# Patient Record
Sex: Male | Born: 1980 | Race: Black or African American | Hispanic: No | Marital: Single | Smoking: Current every day smoker
Health system: Southern US, Community
[De-identification: ages and names within clinical notes are randomized; demographics above are authoritative.]

## PROBLEM LIST (undated history)

## (undated) DIAGNOSIS — Z9049 Acquired absence of other specified parts of digestive tract: Secondary | ICD-10-CM

## (undated) DIAGNOSIS — W3400XA Accidental discharge from unspecified firearms or gun, initial encounter: Secondary | ICD-10-CM

## (undated) DIAGNOSIS — K56609 Unspecified intestinal obstruction, unspecified as to partial versus complete obstruction: Secondary | ICD-10-CM

## (undated) HISTORY — PX: ABDOMINAL SURGERY: SHX537

## (undated) HISTORY — PX: CHOLECYSTECTOMY: SHX55

## (undated) HISTORY — PX: HIP SURGERY: SHX245

## (undated) HISTORY — PX: ARM WOUND REPAIR / CLOSURE: SUR1141

---

## 2010-04-22 ENCOUNTER — Inpatient Hospital Stay (HOSPITAL_COMMUNITY): Payer: Self-pay

## 2010-04-22 ENCOUNTER — Inpatient Hospital Stay (HOSPITAL_COMMUNITY)
Admission: EM | Admit: 2010-04-22 | Discharge: 2010-05-25 | DRG: 957 | Disposition: A | Discharge status: Home Health | Payer: MEDICAID | Attending: Surgery | Admitting: Surgery

## 2010-04-22 ENCOUNTER — Emergency Department (HOSPITAL_COMMUNITY): Payer: Self-pay

## 2010-04-22 ENCOUNTER — Other Ambulatory Visit: Payer: Self-pay | Admitting: General Surgery

## 2010-04-22 DIAGNOSIS — S21109A Unspecified open wound of unspecified front wall of thorax without penetration into thoracic cavity, initial encounter: Secondary | ICD-10-CM | POA: Diagnosis present

## 2010-04-22 DIAGNOSIS — IMO0002 Reserved for concepts with insufficient information to code with codable children: Secondary | ICD-10-CM | POA: Diagnosis not present

## 2010-04-22 DIAGNOSIS — S71009A Unspecified open wound, unspecified hip, initial encounter: Secondary | ICD-10-CM | POA: Diagnosis present

## 2010-04-22 DIAGNOSIS — T794XXA Traumatic shock, initial encounter: Secondary | ICD-10-CM | POA: Diagnosis present

## 2010-04-22 DIAGNOSIS — D62 Acute posthemorrhagic anemia: Secondary | ICD-10-CM | POA: Diagnosis not present

## 2010-04-22 DIAGNOSIS — S52209B Unspecified fracture of shaft of unspecified ulna, initial encounter for open fracture type I or II: Secondary | ICD-10-CM | POA: Diagnosis present

## 2010-04-22 DIAGNOSIS — T8140XA Infection following a procedure, unspecified, initial encounter: Secondary | ICD-10-CM | POA: Diagnosis not present

## 2010-04-22 DIAGNOSIS — K929 Disease of digestive system, unspecified: Secondary | ICD-10-CM | POA: Diagnosis not present

## 2010-04-22 DIAGNOSIS — S36499A Other injury of unspecified part of small intestine, initial encounter: Principal | ICD-10-CM | POA: Diagnosis present

## 2010-04-22 DIAGNOSIS — S36129A Unspecified injury of gallbladder, initial encounter: Secondary | ICD-10-CM | POA: Diagnosis present

## 2010-04-22 DIAGNOSIS — Y836 Removal of other organ (partial) (total) as the cause of abnormal reaction of the patient, or of later complication, without mention of misadventure at the time of the procedure: Secondary | ICD-10-CM | POA: Diagnosis not present

## 2010-04-22 DIAGNOSIS — S36500A Unspecified injury of ascending [right] colon, initial encounter: Secondary | ICD-10-CM | POA: Diagnosis present

## 2010-04-22 DIAGNOSIS — R7309 Other abnormal glucose: Secondary | ICD-10-CM | POA: Diagnosis not present

## 2010-04-22 DIAGNOSIS — L02219 Cutaneous abscess of trunk, unspecified: Secondary | ICD-10-CM | POA: Diagnosis not present

## 2010-04-22 DIAGNOSIS — S3613XA Injury of bile duct, initial encounter: Secondary | ICD-10-CM | POA: Diagnosis present

## 2010-04-22 DIAGNOSIS — K560 Paralytic ileus: Secondary | ICD-10-CM | POA: Diagnosis not present

## 2010-04-22 DIAGNOSIS — S71109A Unspecified open wound, unspecified thigh, initial encounter: Secondary | ICD-10-CM | POA: Diagnosis present

## 2010-04-22 DIAGNOSIS — E876 Hypokalemia: Secondary | ICD-10-CM | POA: Diagnosis not present

## 2010-04-22 DIAGNOSIS — K651 Peritoneal abscess: Secondary | ICD-10-CM | POA: Diagnosis not present

## 2010-04-22 DIAGNOSIS — Y832 Surgical operation with anastomosis, bypass or graft as the cause of abnormal reaction of the patient, or of later complication, without mention of misadventure at the time of the procedure: Secondary | ICD-10-CM | POA: Diagnosis not present

## 2010-04-22 DIAGNOSIS — S0100XA Unspecified open wound of scalp, initial encounter: Secondary | ICD-10-CM | POA: Diagnosis present

## 2010-04-22 DIAGNOSIS — S36116A Major laceration of liver, initial encounter: Secondary | ICD-10-CM | POA: Diagnosis present

## 2010-04-22 DIAGNOSIS — S271XXA Traumatic hemothorax, initial encounter: Secondary | ICD-10-CM | POA: Diagnosis present

## 2010-04-22 DIAGNOSIS — G43909 Migraine, unspecified, not intractable, without status migrainosus: Secondary | ICD-10-CM | POA: Diagnosis present

## 2010-04-22 LAB — POCT I-STAT, CHEM 8
Creatinine, Ser: 1.4 mg/dL (ref 0.4–1.5)
Hemoglobin: 15.3 g/dL (ref 13.0–17.0)
Sodium: 142 meq/L (ref 135–145)
TCO2: 24 mmol/L (ref 0–100)

## 2010-04-22 LAB — CBC
HCT: 32.4 % — ABNORMAL LOW (ref 39.0–52.0)
HCT: 32.9 % — ABNORMAL LOW (ref 39.0–52.0)
Hemoglobin: 11.3 g/dL — ABNORMAL LOW (ref 13.0–17.0)
MCH: 26.3 pg (ref 26.0–34.0)
MCH: 27 pg (ref 26.0–34.0)
MCHC: 33.3 g/dL (ref 30.0–36.0)
MCHC: 34.3 g/dL (ref 30.0–36.0)
Platelets: 201 10*3/uL (ref 150–400)
RDW: 14.5 % (ref 11.5–15.5)
RDW: 14.9 % (ref 11.5–15.5)
WBC: 3.3 10*3/uL — ABNORMAL LOW (ref 4.0–10.5)

## 2010-04-22 LAB — COMPREHENSIVE METABOLIC PANEL
Albumin: 3.3 g/dL — ABNORMAL LOW (ref 3.5–5.2)
BUN: 10 mg/dL (ref 6–23)
Chloride: 104 mEq/L (ref 96–112)
Creatinine, Ser: 1.29 mg/dL (ref 0.4–1.5)
Total Bilirubin: 0.7 mg/dL (ref 0.3–1.2)

## 2010-04-22 LAB — POCT I-STAT 7, (LYTES, BLD GAS, ICA,H+H)
Acid-base deficit: 4 mmol/L — ABNORMAL HIGH (ref 0.0–2.0)
Bicarbonate: 23.6 meq/L (ref 20.0–24.0)
Calcium, Ion: 0.35 mmol/L — CL (ref 1.12–1.32)
HCT: 34 % — ABNORMAL LOW (ref 39.0–52.0)
Hemoglobin: 11.6 g/dL — ABNORMAL LOW (ref 13.0–17.0)
O2 Saturation: 100 %
Potassium: 4.4 meq/L (ref 3.5–5.1)
Potassium: 6.7 meq/L (ref 3.5–5.1)
Sodium: 139 meq/L (ref 135–145)
Sodium: 141 meq/L (ref 135–145)
TCO2: 23 mmol/L (ref 0–100)
pH, Arterial: 7.264 — ABNORMAL LOW (ref 7.350–7.450)

## 2010-04-22 LAB — BASIC METABOLIC PANEL
Calcium: 7.4 mg/dL — ABNORMAL LOW (ref 8.4–10.5)
Chloride: 112 mEq/L (ref 96–112)
Creatinine, Ser: 0.92 mg/dL (ref 0.4–1.5)
Creatinine, Ser: 1.04 mg/dL (ref 0.4–1.5)
GFR calc Af Amer: >60 mL/min (ref >60)
GFR calc non Af Amer: >60 mL/min (ref >60)
GFR calc non Af Amer: >60 mL/min (ref >60)
Glucose, Bld: 144 mg/dL — ABNORMAL HIGH (ref 70–99)
Potassium: 3.5 mEq/L (ref 3.5–5.1)
Sodium: 142 mEq/L (ref 135–145)

## 2010-04-22 LAB — PROTIME-INR
INR: 1.01 (ref 0.00–1.49)
INR: 1.26 (ref 0.00–1.49)
Prothrombin Time: 13.5 seconds (ref 11.6–15.2)
Prothrombin Time: 16 seconds — ABNORMAL HIGH (ref 11.6–15.2)

## 2010-04-22 LAB — GLUCOSE, CAPILLARY
Glucose-Capillary: 209 mg/dL — ABNORMAL HIGH (ref 70–99)
Glucose-Capillary: 140 mg/dL — ABNORMAL HIGH (ref 70–99)

## 2010-04-22 LAB — MRSA PCR SCREENING: MRSA by PCR: NEGATIVE

## 2010-04-22 LAB — LACTIC ACID, PLASMA
Lactic Acid, Venous: 5.2 mmol/L — ABNORMAL HIGH (ref 0.5–2.2)
Lactic Acid, Venous: 3.6 mmol/L — ABNORMAL HIGH (ref 0.5–2.2)

## 2010-04-22 LAB — POCT I-STAT GLUCOSE: Glucose, Bld: 331 mg/dL — ABNORMAL HIGH (ref 70–99)

## 2010-04-23 ENCOUNTER — Inpatient Hospital Stay (HOSPITAL_COMMUNITY): Payer: Self-pay

## 2010-04-23 LAB — POCT I-STAT 3, ART BLOOD GAS (G3+)
Acid-base deficit: 1 mmol/L (ref 0.0–2.0)
Acid-base deficit: 1 mmol/L (ref 0.0–2.0)
Bicarbonate: 23.7 meq/L (ref 20.0–24.0)
O2 Saturation: 99 %
TCO2: 24 mmol/L (ref 0–100)
pCO2 arterial: 36.3 mmHg (ref 35.0–45.0)
pO2, Arterial: 147 mmHg — ABNORMAL HIGH (ref 80.0–100.0)

## 2010-04-23 LAB — DIFFERENTIAL
Basophils Relative: 0 % (ref 0–1)
Eosinophils Absolute: 0.1 10*3/uL (ref 0.0–0.7)
Lymphocytes Relative: 8 % — ABNORMAL LOW (ref 12–46)
Neutro Abs: 6.1 10*3/uL (ref 1.7–7.7)

## 2010-04-23 LAB — COMPREHENSIVE METABOLIC PANEL
ALT: 856 U/L — ABNORMAL HIGH (ref 0–53)
AST: 1181 U/L — ABNORMAL HIGH (ref 0–37)
Alkaline Phosphatase: 33 U/L — ABNORMAL LOW (ref 39–117)
CO2: 22 mEq/L (ref 19–32)
Calcium: 7.3 mg/dL — ABNORMAL LOW (ref 8.4–10.5)
Chloride: 113 mEq/L — ABNORMAL HIGH (ref 96–112)
GFR calc Af Amer: >60 mL/min (ref >60)
GFR calc non Af Amer: 60 mL/min — ABNORMAL LOW (ref >60)
Potassium: 3.8 mEq/L (ref 3.5–5.1)
Sodium: 140 mEq/L (ref 135–145)
Total Bilirubin: 1.7 mg/dL — ABNORMAL HIGH (ref 0.3–1.2)

## 2010-04-23 LAB — CBC
HCT: 25.2 % — ABNORMAL LOW (ref 39.0–52.0)
Hemoglobin: 10.6 g/dL — ABNORMAL LOW (ref 13.0–17.0)
Hemoglobin: 9 g/dL — ABNORMAL LOW (ref 13.0–17.0)
MCH: 27.2 pg (ref 26.0–34.0)
MCH: 27.6 pg (ref 26.0–34.0)
MCHC: 35.7 g/dL (ref 30.0–36.0)
MCV: 77.9 fL — ABNORMAL LOW (ref 78.0–100.0)
RBC: 3.89 MIL/uL — ABNORMAL LOW (ref 4.22–5.81)

## 2010-04-23 LAB — GLUCOSE, CAPILLARY
Glucose-Capillary: 92 mg/dL (ref 70–99)
Glucose-Capillary: 91 mg/dL (ref 70–99)

## 2010-04-24 ENCOUNTER — Inpatient Hospital Stay (HOSPITAL_COMMUNITY): Payer: Self-pay

## 2010-04-24 LAB — CBC
HCT: 20.5 % — ABNORMAL LOW (ref 39.0–52.0)
Hemoglobin: 7.4 g/dL — ABNORMAL LOW (ref 13.0–17.0)
MCV: 77.4 fL — ABNORMAL LOW (ref 78.0–100.0)
RBC: 2.65 MIL/uL — ABNORMAL LOW (ref 4.22–5.81)
WBC: 4.2 10*3/uL (ref 4.0–10.5)

## 2010-04-24 LAB — BASIC METABOLIC PANEL
BUN: 17 mg/dL (ref 6–23)
Chloride: 107 mEq/L (ref 96–112)
Glucose, Bld: 88 mg/dL (ref 70–99)
Potassium: 3.2 mEq/L — ABNORMAL LOW (ref 3.5–5.1)

## 2010-04-24 LAB — DIFFERENTIAL
Eosinophils Relative: 1 % (ref 0–5)
Monocytes Relative: 4 % (ref 3–12)
Neutrophils Relative %: 87 % — ABNORMAL HIGH (ref 43–77)
WBC Morphology: INCREASED

## 2010-04-24 LAB — GLUCOSE, CAPILLARY
Glucose-Capillary: 65 mg/dL — ABNORMAL LOW (ref 70–99)
Glucose-Capillary: 64 mg/dL — ABNORMAL LOW (ref 70–99)
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
## 2010-04-25 LAB — TYPE AND SCREEN
ABO/RH(D): O POS
Unit division: 0
Unit division: 0
Unit division: 0
Unit division: 0
Unit division: 0
Unit division: 0
Unit division: 0
Unit division: 0
Unit division: 0
Unit division: 0

## 2010-04-25 LAB — GLUCOSE, CAPILLARY
Glucose-Capillary: 101 mg/dL — ABNORMAL HIGH (ref 70–99)
Glucose-Capillary: 104 mg/dL — ABNORMAL HIGH (ref 70–99)
Glucose-Capillary: 118 mg/dL — ABNORMAL HIGH (ref 70–99)
Glucose-Capillary: 51 mg/dL — ABNORMAL LOW (ref 70–99)
Glucose-Capillary: 56 mg/dL — ABNORMAL LOW (ref 70–99)
Glucose-Capillary: 65 mg/dL — ABNORMAL LOW (ref 70–99)
Glucose-Capillary: 86 mg/dL (ref 70–99)
Glucose-Capillary: 97 mg/dL (ref 70–99)
Glucose-Capillary: 99 mg/dL (ref 70–99)

## 2010-04-25 LAB — BLOOD GAS, ARTERIAL
Drawn by: 252031
FIO2: 0.4 %
MECHVT: 660 mL
O2 Saturation: 97.9 %
PEEP: 5 cmH2O
Patient temperature: 100
RATE: 16 resp/min

## 2010-04-25 LAB — CBC
Hemoglobin: 8.6 g/dL — ABNORMAL LOW (ref 13.0–17.0)
MCH: 28.4 pg (ref 26.0–34.0)
MCHC: 36.4 g/dL — ABNORMAL HIGH (ref 30.0–36.0)
MCV: 77.9 fL — ABNORMAL LOW (ref 78.0–100.0)
Platelets: 69 10*3/uL — ABNORMAL LOW (ref 150–400)

## 2010-04-25 LAB — BASIC METABOLIC PANEL
BUN: 10 mg/dL (ref 6–23)
CO2: 25 mEq/L (ref 19–32)
Calcium: 6.8 mg/dL — ABNORMAL LOW (ref 8.4–10.5)
Creatinine, Ser: 0.99 mg/dL (ref 0.4–1.5)
GFR calc Af Amer: >60 mL/min (ref >60)

## 2010-04-25 LAB — DIFFERENTIAL
Basophils Relative: 0 % (ref 0–1)
Eosinophils Absolute: 0 10*3/uL (ref 0.0–0.7)
Monocytes Absolute: 0.2 10*3/uL (ref 0.1–1.0)
Neutro Abs: 4.2 10*3/uL (ref 1.7–7.7)

## 2010-04-26 ENCOUNTER — Inpatient Hospital Stay (HOSPITAL_COMMUNITY): Payer: Self-pay

## 2010-04-26 LAB — DIFFERENTIAL
Basophils Absolute: 0 10*3/uL (ref 0.0–0.1)
Eosinophils Absolute: 0.1 10*3/uL (ref 0.0–0.7)
Eosinophils Relative: 3 % (ref 0–5)
Monocytes Absolute: 0.6 10*3/uL (ref 0.1–1.0)

## 2010-04-26 LAB — GLUCOSE, CAPILLARY
Glucose-Capillary: 128 mg/dL — ABNORMAL HIGH (ref 70–99)
Glucose-Capillary: 116 mg/dL — ABNORMAL HIGH (ref 70–99)
Glucose-Capillary: 126 mg/dL — ABNORMAL HIGH (ref 70–99)

## 2010-04-26 LAB — CBC
HCT: 23.3 % — ABNORMAL LOW (ref 39.0–52.0)
MCHC: 35.6 g/dL (ref 30.0–36.0)
Platelets: 96 10*3/uL — ABNORMAL LOW (ref 150–400)
RDW: 15.1 % (ref 11.5–15.5)
WBC: 5.5 10*3/uL (ref 4.0–10.5)

## 2010-04-26 LAB — BASIC METABOLIC PANEL
CO2: 24 mEq/L (ref 19–32)
Chloride: 107 mEq/L (ref 96–112)
GFR calc Af Amer: >60 mL/min (ref >60)
Sodium: 135 mEq/L (ref 135–145)

## 2010-04-26 LAB — COMPREHENSIVE METABOLIC PANEL
ALT: 233 U/L — ABNORMAL HIGH (ref 0–53)
AST: 151 U/L — ABNORMAL HIGH (ref 0–37)
CO2: 25 mEq/L (ref 19–32)
Calcium: 7 mg/dL — ABNORMAL LOW (ref 8.4–10.5)
Creatinine, Ser: 0.98 mg/dL (ref 0.4–1.5)
GFR calc Af Amer: >60 mL/min (ref >60)
GFR calc non Af Amer: >60 mL/min (ref >60)
Glucose, Bld: 131 mg/dL — ABNORMAL HIGH (ref 70–99)
Sodium: 135 mEq/L (ref 135–145)
Total Protein: 4.3 g/dL — ABNORMAL LOW (ref 6.0–8.3)

## 2010-04-26 LAB — POCT I-STAT 3, VENOUS BLOOD GAS (G3P V)
Acid-Base Excess: 2 mmol/L (ref 0.0–2.0)
O2 Saturation: 99 %
Patient temperature: 39

## 2010-04-26 LAB — TRIGLYCERIDES: Triglycerides: 141 mg/dL (ref <150)

## 2010-04-26 LAB — MAGNESIUM: Magnesium: 1.3 mg/dL — ABNORMAL LOW (ref 1.5–2.5)

## 2010-04-27 ENCOUNTER — Inpatient Hospital Stay (HOSPITAL_COMMUNITY): Payer: Self-pay

## 2010-04-27 LAB — COMPREHENSIVE METABOLIC PANEL
AST: 91 U/L — ABNORMAL HIGH (ref 0–37)
Albumin: 1.6 g/dL — ABNORMAL LOW (ref 3.5–5.2)
Alkaline Phosphatase: 51 U/L (ref 39–117)
BUN: 4 mg/dL — ABNORMAL LOW (ref 6–23)
Chloride: 106 mEq/L (ref 96–112)
Potassium: 3.1 mEq/L — ABNORMAL LOW (ref 3.5–5.1)
Total Bilirubin: 1.1 mg/dL (ref 0.3–1.2)

## 2010-04-27 LAB — TYPE AND SCREEN: ABO/RH(D): O POS

## 2010-04-27 LAB — DIFFERENTIAL
Eosinophils Relative: 4 % (ref 0–5)
Lymphocytes Relative: 8 % — ABNORMAL LOW (ref 12–46)
Monocytes Absolute: 0.6 10*3/uL (ref 0.1–1.0)
Monocytes Relative: 9 % (ref 3–12)
Neutrophils Relative %: 78 % — ABNORMAL HIGH (ref 43–77)
WBC Morphology: INCREASED

## 2010-04-27 LAB — CBC
Hemoglobin: 8.3 g/dL — ABNORMAL LOW (ref 13.0–17.0)
MCV: 78.7 fL (ref 78.0–100.0)
Platelets: 114 10*3/uL — ABNORMAL LOW (ref 150–400)
RBC: 3 MIL/uL — ABNORMAL LOW (ref 4.22–5.81)
WBC: 6.7 10*3/uL (ref 4.0–10.5)

## 2010-04-27 LAB — PATHOLOGIST SMEAR REVIEW

## 2010-04-27 LAB — PHOSPHORUS: Phosphorus: 3.2 mg/dL (ref 2.3–4.6)

## 2010-04-27 LAB — GLUCOSE, CAPILLARY: Glucose-Capillary: 124 mg/dL — ABNORMAL HIGH (ref 70–99)

## 2010-04-28 ENCOUNTER — Inpatient Hospital Stay (HOSPITAL_COMMUNITY): Payer: Self-pay

## 2010-04-28 LAB — CROSSMATCH
Antibody Screen: NEGATIVE
Unit division: 0
Unit division: 0
Unit division: 0
Unit division: 0

## 2010-04-28 LAB — PREPARE FRESH FROZEN PLASMA
Unit division: 0
Unit division: 0
Unit division: 0
Unit division: 0
Unit division: 0
Unit division: 0
Unit division: 0
Unit division: 0

## 2010-04-28 LAB — BASIC METABOLIC PANEL
CO2: 25 mEq/L (ref 19–32)
Calcium: 7.1 mg/dL — ABNORMAL LOW (ref 8.4–10.5)
GFR calc Af Amer: >60 mL/min (ref >60)
GFR calc non Af Amer: >60 mL/min (ref >60)
Glucose, Bld: 162 mg/dL — ABNORMAL HIGH (ref 70–99)
Potassium: 3.5 mEq/L (ref 3.5–5.1)
Sodium: 137 mEq/L (ref 135–145)

## 2010-04-28 LAB — URINE CULTURE
Colony Count: NO GROWTH
Culture  Setup Time: 201204222148
Culture: NO GROWTH

## 2010-04-28 LAB — CBC
HCT: 25.5 % — ABNORMAL LOW (ref 39.0–52.0)
Hemoglobin: 9.1 g/dL — ABNORMAL LOW (ref 13.0–17.0)
WBC: 13.2 10*3/uL — ABNORMAL HIGH (ref 4.0–10.5)

## 2010-04-28 LAB — GLUCOSE, CAPILLARY
Glucose-Capillary: 153 mg/dL — ABNORMAL HIGH (ref 70–99)
Glucose-Capillary: 143 mg/dL — ABNORMAL HIGH (ref 70–99)

## 2010-04-28 LAB — CULTURE, RESPIRATORY W GRAM STAIN

## 2010-04-29 LAB — GLUCOSE, CAPILLARY
Glucose-Capillary: 192 mg/dL — ABNORMAL HIGH (ref 70–99)
Glucose-Capillary: 178 mg/dL — ABNORMAL HIGH (ref 70–99)

## 2010-04-29 LAB — CULTURE, BLOOD (ROUTINE X 2)
Culture  Setup Time: 201204190435
Culture  Setup Time: 201204190435
Culture: NO GROWTH

## 2010-04-29 LAB — BASIC METABOLIC PANEL
BUN: 7 mg/dL (ref 6–23)
CO2: 27 mEq/L (ref 19–32)
Chloride: 105 mEq/L (ref 96–112)
Glucose, Bld: 177 mg/dL — ABNORMAL HIGH (ref 70–99)
Potassium: 3.6 mEq/L (ref 3.5–5.1)
Sodium: 136 mEq/L (ref 135–145)

## 2010-04-29 LAB — CBC
HCT: 22.4 % — ABNORMAL LOW (ref 39.0–52.0)
Hemoglobin: 7.8 g/dL — ABNORMAL LOW (ref 13.0–17.0)
MCV: 81.5 fL (ref 78.0–100.0)
WBC: 15.9 10*3/uL — ABNORMAL HIGH (ref 4.0–10.5)

## 2010-04-29 LAB — CALCIUM, IONIZED: Calcium, Ion: 1.08 mmol/L — ABNORMAL LOW (ref 1.12–1.32)

## 2010-04-30 ENCOUNTER — Inpatient Hospital Stay (HOSPITAL_COMMUNITY): Payer: Self-pay

## 2010-04-30 LAB — DIFFERENTIAL
Basophils Relative: 0 % (ref 0–1)
Eosinophils Relative: 2 % (ref 0–5)
Lymphs Abs: 1.2 10*3/uL (ref 0.7–4.0)
Monocytes Relative: 5 % (ref 3–12)

## 2010-04-30 LAB — URINALYSIS, ROUTINE W REFLEX MICROSCOPIC
Bilirubin Urine: NEGATIVE
Glucose, UA: 500 mg/dL — AB
Nitrite: NEGATIVE
Specific Gravity, Urine: 1.015 (ref 1.005–1.030)
pH: 6.5 (ref 5.0–8.0)

## 2010-04-30 LAB — URINE MICROSCOPIC-ADD ON

## 2010-04-30 LAB — GLUCOSE, CAPILLARY
Glucose-Capillary: 180 mg/dL — ABNORMAL HIGH (ref 70–99)
Glucose-Capillary: 179 mg/dL — ABNORMAL HIGH (ref 70–99)

## 2010-04-30 LAB — COMPREHENSIVE METABOLIC PANEL
ALT: 87 U/L — ABNORMAL HIGH (ref 0–53)
AST: 73 U/L — ABNORMAL HIGH (ref 0–37)
CO2: 27 mEq/L (ref 19–32)
Chloride: 105 mEq/L (ref 96–112)
Creatinine, Ser: 1.07 mg/dL (ref 0.4–1.5)
GFR calc Af Amer: >60 mL/min (ref >60)
GFR calc non Af Amer: >60 mL/min (ref >60)
Glucose, Bld: 186 mg/dL — ABNORMAL HIGH (ref 70–99)
Sodium: 139 mEq/L (ref 135–145)
Total Bilirubin: 1.3 mg/dL — ABNORMAL HIGH (ref 0.3–1.2)

## 2010-04-30 LAB — CBC
HCT: 21.9 % — ABNORMAL LOW (ref 39.0–52.0)
Hemoglobin: 7.5 g/dL — ABNORMAL LOW (ref 13.0–17.0)
MCH: 28.2 pg (ref 26.0–34.0)
RBC: 2.66 MIL/uL — ABNORMAL LOW (ref 4.22–5.81)

## 2010-04-30 LAB — WOUND CULTURE

## 2010-05-01 ENCOUNTER — Inpatient Hospital Stay (HOSPITAL_COMMUNITY): Payer: Self-pay

## 2010-05-01 DIAGNOSIS — J90 Pleural effusion, not elsewhere classified: Secondary | ICD-10-CM

## 2010-05-01 DIAGNOSIS — J690 Pneumonitis due to inhalation of food and vomit: Secondary | ICD-10-CM

## 2010-05-01 DIAGNOSIS — J9600 Acute respiratory failure, unspecified whether with hypoxia or hypercapnia: Secondary | ICD-10-CM

## 2010-05-01 LAB — POCT I-STAT 3, ART BLOOD GAS (G3+)
Acid-Base Excess: 7 mmol/L — ABNORMAL HIGH (ref 0.0–2.0)
Bicarbonate: 32.1 meq/L — ABNORMAL HIGH (ref 20.0–24.0)
Bicarbonate: 33.1 meq/L — ABNORMAL HIGH (ref 20.0–24.0)
O2 Saturation: 96 %
O2 Saturation: 92 %
Patient temperature: 37.5
TCO2: 34 mmol/L (ref 0–100)
pCO2 arterial: 52.7 mmHg — ABNORMAL HIGH (ref 35.0–45.0)
pH, Arterial: 7.395 (ref 7.350–7.450)
pO2, Arterial: 69 mmHg — ABNORMAL LOW (ref 80.0–100.0)

## 2010-05-01 LAB — URINE CULTURE
Colony Count: NO GROWTH
Culture  Setup Time: 201204261400
Culture: NO GROWTH

## 2010-05-01 LAB — BASIC METABOLIC PANEL
BUN: 9 mg/dL (ref 6–23)
Chloride: 104 mEq/L (ref 96–112)
Glucose, Bld: 192 mg/dL — ABNORMAL HIGH (ref 70–99)
Potassium: 4.2 mEq/L (ref 3.5–5.1)

## 2010-05-01 LAB — CBC
HCT: 23 % — ABNORMAL LOW (ref 39.0–52.0)
MCV: 84.6 fL (ref 78.0–100.0)
RBC: 2.72 MIL/uL — ABNORMAL LOW (ref 4.22–5.81)
WBC: 20.6 10*3/uL — ABNORMAL HIGH (ref 4.0–10.5)

## 2010-05-02 ENCOUNTER — Inpatient Hospital Stay (HOSPITAL_COMMUNITY): Payer: Self-pay

## 2010-05-02 LAB — COMPREHENSIVE METABOLIC PANEL
ALT: 82 U/L — ABNORMAL HIGH (ref 0–53)
BUN: 8 mg/dL (ref 6–23)
Calcium: 7.7 mg/dL — ABNORMAL LOW (ref 8.4–10.5)
Creatinine, Ser: 0.88 mg/dL (ref 0.4–1.5)
Glucose, Bld: 170 mg/dL — ABNORMAL HIGH (ref 70–99)
Sodium: 139 mEq/L (ref 135–145)
Total Protein: 5.5 g/dL — ABNORMAL LOW (ref 6.0–8.3)

## 2010-05-02 LAB — ANAEROBIC CULTURE

## 2010-05-02 LAB — GLUCOSE, CAPILLARY
Glucose-Capillary: 139 mg/dL — ABNORMAL HIGH (ref 70–99)
Glucose-Capillary: 149 mg/dL — ABNORMAL HIGH (ref 70–99)
Glucose-Capillary: 157 mg/dL — ABNORMAL HIGH (ref 70–99)

## 2010-05-02 LAB — MAGNESIUM: Magnesium: 2 mg/dL (ref 1.5–2.5)

## 2010-05-02 LAB — CBC
HCT: 23.1 % — ABNORMAL LOW (ref 39.0–52.0)
MCHC: 32.9 g/dL (ref 30.0–36.0)
RDW: 16.4 % — ABNORMAL HIGH (ref 11.5–15.5)

## 2010-05-02 LAB — PHOSPHORUS: Phosphorus: 2.8 mg/dL (ref 2.3–4.6)

## 2010-05-03 ENCOUNTER — Inpatient Hospital Stay (HOSPITAL_COMMUNITY): Payer: Self-pay

## 2010-05-03 LAB — CBC
HCT: 21.5 % — ABNORMAL LOW (ref 39.0–52.0)
Hemoglobin: 7.2 g/dL — ABNORMAL LOW (ref 13.0–17.0)
MCH: 27.7 pg (ref 26.0–34.0)
MCV: 82.7 fL (ref 78.0–100.0)
RBC: 2.6 MIL/uL — ABNORMAL LOW (ref 4.22–5.81)

## 2010-05-03 LAB — COMPREHENSIVE METABOLIC PANEL
AST: 54 U/L — ABNORMAL HIGH (ref 0–37)
BUN: 8 mg/dL (ref 6–23)
CO2: 28 mEq/L (ref 19–32)
Chloride: 102 mEq/L (ref 96–112)
Creatinine, Ser: 0.87 mg/dL (ref 0.4–1.5)
GFR calc Af Amer: >60 mL/min (ref >60)
GFR calc non Af Amer: >60 mL/min (ref >60)
Glucose, Bld: 147 mg/dL — ABNORMAL HIGH (ref 70–99)
Total Bilirubin: 1 mg/dL (ref 0.3–1.2)

## 2010-05-03 LAB — GLUCOSE, CAPILLARY
Glucose-Capillary: 161 mg/dL — ABNORMAL HIGH (ref 70–99)
Glucose-Capillary: 126 mg/dL — ABNORMAL HIGH (ref 70–99)
Glucose-Capillary: 137 mg/dL — ABNORMAL HIGH (ref 70–99)

## 2010-05-03 LAB — POCT I-STAT 3, ART BLOOD GAS (G3+)
Bicarbonate: 30.2 meq/L — ABNORMAL HIGH (ref 20.0–24.0)
O2 Saturation: 94 %
TCO2: 32 mmol/L (ref 0–100)
pCO2 arterial: 46.7 mmHg — ABNORMAL HIGH (ref 35.0–45.0)
pO2, Arterial: 72 mmHg — ABNORMAL LOW (ref 80.0–100.0)

## 2010-05-03 LAB — MAGNESIUM: Magnesium: 1.9 mg/dL (ref 1.5–2.5)

## 2010-05-04 ENCOUNTER — Inpatient Hospital Stay (HOSPITAL_COMMUNITY): Payer: Self-pay

## 2010-05-04 LAB — CHOLESTEROL, TOTAL: Cholesterol: 99 mg/dL (ref 0–200)

## 2010-05-04 LAB — CBC
HCT: 23.4 % — ABNORMAL LOW (ref 39.0–52.0)
MCV: 83.3 fL (ref 78.0–100.0)
Platelets: 651 10*3/uL — ABNORMAL HIGH (ref 150–400)
RBC: 2.81 MIL/uL — ABNORMAL LOW (ref 4.22–5.81)
WBC: 15 10*3/uL — ABNORMAL HIGH (ref 4.0–10.5)

## 2010-05-04 LAB — DIFFERENTIAL
Basophils Relative: 0 % (ref 0–1)
Lymphocytes Relative: 9 % — ABNORMAL LOW (ref 12–46)
Monocytes Relative: 3 % (ref 3–12)
Neutro Abs: 13.1 10*3/uL — ABNORMAL HIGH (ref 1.7–7.7)

## 2010-05-04 LAB — COMPREHENSIVE METABOLIC PANEL
ALT: 80 U/L — ABNORMAL HIGH (ref 0–53)
Albumin: 2 g/dL — ABNORMAL LOW (ref 3.5–5.2)
Alkaline Phosphatase: 207 U/L — ABNORMAL HIGH (ref 39–117)
Potassium: 3.7 mEq/L (ref 3.5–5.1)
Sodium: 134 mEq/L — ABNORMAL LOW (ref 135–145)
Total Protein: 5.9 g/dL — ABNORMAL LOW (ref 6.0–8.3)

## 2010-05-04 LAB — GLUCOSE, CAPILLARY
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
## 2010-05-05 DIAGNOSIS — S271XXA Traumatic hemothorax, initial encounter: Secondary | ICD-10-CM

## 2010-05-05 DIAGNOSIS — S52009A Unspecified fracture of upper end of unspecified ulna, initial encounter for closed fracture: Secondary | ICD-10-CM

## 2010-05-05 LAB — CBC
MCH: 27.3 pg (ref 26.0–34.0)
MCHC: 32.8 g/dL (ref 30.0–36.0)
Platelets: 739 10*3/uL — ABNORMAL HIGH (ref 150–400)
RDW: 16.5 % — ABNORMAL HIGH (ref 11.5–15.5)

## 2010-05-05 LAB — GLUCOSE, CAPILLARY
Glucose-Capillary: 106 mg/dL — ABNORMAL HIGH (ref 70–99)
Glucose-Capillary: 121 mg/dL — ABNORMAL HIGH (ref 70–99)
Glucose-Capillary: 136 mg/dL — ABNORMAL HIGH (ref 70–99)

## 2010-05-06 ENCOUNTER — Inpatient Hospital Stay (HOSPITAL_COMMUNITY): Payer: Self-pay

## 2010-05-06 LAB — URINALYSIS, ROUTINE W REFLEX MICROSCOPIC
Bilirubin Urine: NEGATIVE
Ketones, ur: NEGATIVE mg/dL
Nitrite: NEGATIVE
Protein, ur: NEGATIVE mg/dL
pH: 6 (ref 5.0–8.0)

## 2010-05-06 LAB — CBC
MCH: 27.5 pg (ref 26.0–34.0)
MCHC: 33.1 g/dL (ref 30.0–36.0)
MCV: 83.2 fL (ref 78.0–100.0)
Platelets: 759 10*3/uL — ABNORMAL HIGH (ref 150–400)
RBC: 2.98 MIL/uL — ABNORMAL LOW (ref 4.22–5.81)

## 2010-05-07 ENCOUNTER — Inpatient Hospital Stay (HOSPITAL_COMMUNITY): Payer: Self-pay

## 2010-05-07 LAB — CBC
HCT: 25.4 % — ABNORMAL LOW (ref 39.0–52.0)
MCHC: 33.1 g/dL (ref 30.0–36.0)
MCV: 82.7 fL (ref 78.0–100.0)
Platelets: 726 10*3/uL — ABNORMAL HIGH (ref 150–400)
RDW: 16 % — ABNORMAL HIGH (ref 11.5–15.5)

## 2010-05-07 LAB — BASIC METABOLIC PANEL
BUN: 15 mg/dL (ref 6–23)
Calcium: 8.7 mg/dL (ref 8.4–10.5)
GFR calc non Af Amer: >60 mL/min (ref >60)
Glucose, Bld: 112 mg/dL — ABNORMAL HIGH (ref 70–99)

## 2010-05-07 MED ORDER — IOHEXOL 300 MG/ML  SOLN
100.0000 mL | Freq: Once | INTRAMUSCULAR | Status: AC | PRN
  Administered 2010-05-07: 100 mL via INTRAVENOUS

## 2010-05-08 ENCOUNTER — Inpatient Hospital Stay (HOSPITAL_COMMUNITY): Payer: Self-pay

## 2010-05-08 LAB — CBC
HCT: 23.4 % — ABNORMAL LOW (ref 39.0–52.0)
Hemoglobin: 7.8 g/dL — ABNORMAL LOW (ref 13.0–17.0)
MCV: 82.1 fL (ref 78.0–100.0)
RBC: 2.85 MIL/uL — ABNORMAL LOW (ref 4.22–5.81)
WBC: 13.5 10*3/uL — ABNORMAL HIGH (ref 4.0–10.5)

## 2010-05-08 LAB — BASIC METABOLIC PANEL
BUN: 13 mg/dL (ref 6–23)
CO2: 25 mEq/L (ref 19–32)
Chloride: 96 mEq/L (ref 96–112)
Glucose, Bld: 114 mg/dL — ABNORMAL HIGH (ref 70–99)
Potassium: 4.3 mEq/L (ref 3.5–5.1)

## 2010-05-09 LAB — CBC
HCT: 23.2 % — ABNORMAL LOW (ref 39.0–52.0)
MCHC: 33.2 g/dL (ref 30.0–36.0)
MCV: 81.1 fL (ref 78.0–100.0)
RDW: 16.1 % — ABNORMAL HIGH (ref 11.5–15.5)
WBC: 14.2 10*3/uL — ABNORMAL HIGH (ref 4.0–10.5)

## 2010-05-09 LAB — BASIC METABOLIC PANEL
BUN: 12 mg/dL (ref 6–23)
Creatinine, Ser: 1 mg/dL (ref 0.4–1.5)
GFR calc Af Amer: >60 mL/min (ref >60)
GFR calc non Af Amer: >60 mL/min (ref >60)
Potassium: 4 mEq/L (ref 3.5–5.1)

## 2010-05-10 LAB — CBC
Platelets: 593 10*3/uL — ABNORMAL HIGH (ref 150–400)
RBC: 3.15 MIL/uL — ABNORMAL LOW (ref 4.22–5.81)
RDW: 16.5 % — ABNORMAL HIGH (ref 11.5–15.5)
WBC: 15.5 10*3/uL — ABNORMAL HIGH (ref 4.0–10.5)

## 2010-05-11 LAB — COMPREHENSIVE METABOLIC PANEL
ALT: 29 U/L (ref 0–53)
AST: 18 U/L (ref 0–37)
Alkaline Phosphatase: 195 U/L — ABNORMAL HIGH (ref 39–117)
CO2: 25 mEq/L (ref 19–32)
Chloride: 104 mEq/L (ref 96–112)
GFR calc Af Amer: >60 mL/min (ref >60)
GFR calc non Af Amer: >60 mL/min (ref >60)
Potassium: 4.2 mEq/L (ref 3.5–5.1)
Sodium: 137 mEq/L (ref 135–145)
Total Bilirubin: 0.3 mg/dL (ref 0.3–1.2)

## 2010-05-11 LAB — PREPARE PLATELET PHERESIS: Unit division: 0

## 2010-05-11 LAB — PREALBUMIN: Prealbumin: 12.9 mg/dL — ABNORMAL LOW (ref 17.0–34.0)

## 2010-05-11 LAB — CBC
HCT: 23.6 % — ABNORMAL LOW (ref 39.0–52.0)
Hemoglobin: 7.7 g/dL — ABNORMAL LOW (ref 13.0–17.0)
RBC: 2.94 MIL/uL — ABNORMAL LOW (ref 4.22–5.81)
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
## 2010-05-12 LAB — CULTURE, BLOOD (ROUTINE X 2)
Culture  Setup Time: 201205022053
Culture  Setup Time: 201205022053
Culture: NO GROWTH

## 2010-05-13 ENCOUNTER — Inpatient Hospital Stay (HOSPITAL_COMMUNITY): Payer: Self-pay

## 2010-05-13 LAB — CBC
HCT: 24.1 % — ABNORMAL LOW (ref 39.0–52.0)
MCH: 26.2 pg (ref 26.0–34.0)
MCV: 80.9 fL (ref 78.0–100.0)
RDW: 17 % — ABNORMAL HIGH (ref 11.5–15.5)
WBC: 12.6 10*3/uL — ABNORMAL HIGH (ref 4.0–10.5)

## 2010-05-13 LAB — CULTURE, ROUTINE-ABSCESS

## 2010-05-14 LAB — CBC
Hemoglobin: 8.1 g/dL — ABNORMAL LOW (ref 13.0–17.0)
MCH: 26.2 pg (ref 26.0–34.0)
MCHC: 32.5 g/dL (ref 30.0–36.0)

## 2010-05-16 LAB — CBC
MCH: 25.5 pg — ABNORMAL LOW (ref 26.0–34.0)
MCV: 80.1 fL (ref 78.0–100.0)
Platelets: 528 10*3/uL — ABNORMAL HIGH (ref 150–400)
RDW: 16.7 % — ABNORMAL HIGH (ref 11.5–15.5)
WBC: 11.9 10*3/uL — ABNORMAL HIGH (ref 4.0–10.5)

## 2010-05-18 ENCOUNTER — Inpatient Hospital Stay (HOSPITAL_COMMUNITY): Payer: Self-pay

## 2010-05-18 LAB — CBC
MCHC: 31.5 g/dL (ref 30.0–36.0)
RDW: 17.4 % — ABNORMAL HIGH (ref 11.5–15.5)

## 2010-05-18 MED ORDER — IOHEXOL 300 MG/ML  SOLN
80.0000 mL | Freq: Once | INTRAMUSCULAR | Status: AC | PRN
  Administered 2010-05-18: 80 mL via INTRAVENOUS

## 2010-05-19 ENCOUNTER — Inpatient Hospital Stay (HOSPITAL_COMMUNITY): Payer: Self-pay

## 2010-05-19 LAB — URINALYSIS, ROUTINE W REFLEX MICROSCOPIC
Nitrite: NEGATIVE
Specific Gravity, Urine: 1.019 (ref 1.005–1.030)
pH: 6.5 (ref 5.0–8.0)

## 2010-05-19 LAB — CBC
Platelets: 537 10*3/uL — ABNORMAL HIGH (ref 150–400)
RBC: 3.39 MIL/uL — ABNORMAL LOW (ref 4.22–5.81)
WBC: 16.1 10*3/uL — ABNORMAL HIGH (ref 4.0–10.5)

## 2010-05-20 LAB — BASIC METABOLIC PANEL
CO2: 28 mEq/L (ref 19–32)
Calcium: 9.1 mg/dL (ref 8.4–10.5)
Chloride: 97 mEq/L (ref 96–112)
GFR calc Af Amer: >60 mL/min (ref >60)
Sodium: 138 mEq/L (ref 135–145)

## 2010-05-20 LAB — URINE CULTURE: Culture  Setup Time: 201205151150

## 2010-05-20 LAB — CBC
Hemoglobin: 9.3 g/dL — ABNORMAL LOW (ref 13.0–17.0)
MCHC: 33.1 g/dL (ref 30.0–36.0)
RBC: 3.47 MIL/uL — ABNORMAL LOW (ref 4.22–5.81)

## 2010-05-21 ENCOUNTER — Inpatient Hospital Stay (HOSPITAL_COMMUNITY): Payer: Self-pay

## 2010-05-21 LAB — BASIC METABOLIC PANEL
CO2: 31 mEq/L (ref 19–32)
Chloride: 100 mEq/L (ref 96–112)
Glucose, Bld: 122 mg/dL — ABNORMAL HIGH (ref 70–99)
Potassium: 3.7 mEq/L (ref 3.5–5.1)
Sodium: 140 mEq/L (ref 135–145)

## 2010-05-21 LAB — CBC
HCT: 30.3 % — ABNORMAL LOW (ref 39.0–52.0)
Hemoglobin: 9.9 g/dL — ABNORMAL LOW (ref 13.0–17.0)
MCV: 81 fL (ref 78.0–100.0)
WBC: 15.6 10*3/uL — ABNORMAL HIGH (ref 4.0–10.5)

## 2010-05-22 ENCOUNTER — Inpatient Hospital Stay (HOSPITAL_COMMUNITY): Payer: Self-pay

## 2010-05-22 LAB — DIFFERENTIAL
Eosinophils Relative: 0 % (ref 0–5)
Lymphocytes Relative: 7 % — ABNORMAL LOW (ref 12–46)
Lymphs Abs: 1.2 10*3/uL (ref 0.7–4.0)
Monocytes Absolute: 1 10*3/uL (ref 0.1–1.0)
Monocytes Relative: 5 % (ref 3–12)

## 2010-05-22 LAB — CBC
HCT: 27.2 % — ABNORMAL LOW (ref 39.0–52.0)
MCH: 26.3 pg (ref 26.0–34.0)
MCV: 81.2 fL (ref 78.0–100.0)
RDW: 17 % — ABNORMAL HIGH (ref 11.5–15.5)
WBC: 18 10*3/uL — ABNORMAL HIGH (ref 4.0–10.5)

## 2010-05-23 DIAGNOSIS — T148XXA Other injury of unspecified body region, initial encounter: Secondary | ICD-10-CM

## 2010-05-23 LAB — CBC
Hemoglobin: 8.1 g/dL — ABNORMAL LOW (ref 13.0–17.0)
MCH: 26.3 pg (ref 26.0–34.0)
MCHC: 32.7 g/dL (ref 30.0–36.0)

## 2010-05-24 LAB — CBC
MCH: 25.8 pg — ABNORMAL LOW (ref 26.0–34.0)
MCV: 80.1 fL (ref 78.0–100.0)
Platelets: 318 10*3/uL (ref 150–400)
RDW: 16.4 % — ABNORMAL HIGH (ref 11.5–15.5)
WBC: 11 10*3/uL — ABNORMAL HIGH (ref 4.0–10.5)

## 2010-05-24 LAB — BASIC METABOLIC PANEL
BUN: 5 mg/dL — ABNORMAL LOW (ref 6–23)
Creatinine, Ser: 0.71 mg/dL (ref 0.4–1.5)
GFR calc non Af Amer: >60 mL/min (ref >60)
Potassium: 3.3 mEq/L — ABNORMAL LOW (ref 3.5–5.1)

## 2010-05-25 LAB — DIFFERENTIAL
Basophils Absolute: 0 10*3/uL (ref 0.0–0.1)
Basophils Relative: 0 % (ref 0–1)
Eosinophils Absolute: 0.4 10*3/uL (ref 0.0–0.7)
Neutrophils Relative %: 77 % (ref 43–77)

## 2010-05-25 LAB — CBC
Platelets: 343 10*3/uL (ref 150–400)
RBC: 3.1 MIL/uL — ABNORMAL LOW (ref 4.22–5.81)
WBC: 11.3 10*3/uL — ABNORMAL HIGH (ref 4.0–10.5)

## 2010-05-25 LAB — BASIC METABOLIC PANEL
Chloride: 97 mEq/L (ref 96–112)
GFR calc Af Amer: >60 mL/min (ref >60)
Potassium: 3.4 mEq/L — ABNORMAL LOW (ref 3.5–5.1)
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
## 2010-07-02 ENCOUNTER — Encounter: Payer: Self-pay | Admitting: Occupational Therapy

## 2010-07-09 ENCOUNTER — Ambulatory Visit: Payer: Self-pay | Attending: Orthopedic Surgery | Admitting: Occupational Therapy

## 2010-07-09 DIAGNOSIS — IMO0001 Reserved for inherently not codable concepts without codable children: Secondary | ICD-10-CM | POA: Insufficient documentation

## 2010-07-09 DIAGNOSIS — M2560 Stiffness of unspecified joint, not elsewhere classified: Secondary | ICD-10-CM | POA: Insufficient documentation

## 2010-07-09 DIAGNOSIS — M2550 Pain in unspecified joint: Secondary | ICD-10-CM | POA: Insufficient documentation

## 2010-07-15 ENCOUNTER — Ambulatory Visit: Payer: Self-pay | Admitting: Occupational Therapy

## 2010-07-21 ENCOUNTER — Ambulatory Visit: Payer: Self-pay | Admitting: Occupational Therapy

## 2010-07-24 ENCOUNTER — Encounter: Payer: Self-pay | Admitting: Occupational Therapy

## 2010-07-27 ENCOUNTER — Ambulatory Visit: Payer: Self-pay | Admitting: Occupational Therapy

## 2010-07-29 ENCOUNTER — Encounter: Payer: Self-pay | Admitting: Occupational Therapy

## 2010-08-03 ENCOUNTER — Ambulatory Visit: Payer: Self-pay | Admitting: Occupational Therapy

## 2010-08-05 ENCOUNTER — Ambulatory Visit: Payer: Self-pay | Attending: Orthopedic Surgery | Admitting: Occupational Therapy

## 2010-08-05 DIAGNOSIS — M2550 Pain in unspecified joint: Secondary | ICD-10-CM | POA: Insufficient documentation

## 2010-08-05 DIAGNOSIS — M2560 Stiffness of unspecified joint, not elsewhere classified: Secondary | ICD-10-CM | POA: Insufficient documentation

## 2010-08-05 DIAGNOSIS — IMO0001 Reserved for inherently not codable concepts without codable children: Secondary | ICD-10-CM | POA: Insufficient documentation

## 2010-08-10 ENCOUNTER — Ambulatory Visit: Payer: Self-pay | Admitting: Occupational Therapy

## 2010-08-12 ENCOUNTER — Ambulatory Visit: Payer: Self-pay | Admitting: Occupational Therapy

## 2010-08-17 ENCOUNTER — Ambulatory Visit: Payer: Self-pay | Admitting: Occupational Therapy

## 2010-08-19 ENCOUNTER — Ambulatory Visit: Payer: Self-pay | Admitting: Occupational Therapy

## 2010-08-24 ENCOUNTER — Ambulatory Visit: Payer: Self-pay | Admitting: Occupational Therapy

## 2010-08-27 ENCOUNTER — Ambulatory Visit: Payer: Self-pay | Admitting: Occupational Therapy

## 2010-09-01 ENCOUNTER — Encounter: Payer: Self-pay | Admitting: Occupational Therapy

## 2010-09-02 ENCOUNTER — Encounter: Payer: Self-pay | Admitting: Occupational Therapy

## 2010-10-12 ENCOUNTER — Inpatient Hospital Stay (INDEPENDENT_AMBULATORY_CARE_PROVIDER_SITE_OTHER)
Admission: RE | Admit: 2010-10-12 | Discharge: 2010-10-12 | Disposition: A | Discharge status: Home / Self Care | Payer: Self-pay | Source: Ambulatory Visit | Attending: Family Medicine | Admitting: Family Medicine

## 2010-10-12 ENCOUNTER — Inpatient Hospital Stay (HOSPITAL_COMMUNITY)
Admission: EM | Admit: 2010-10-12 | Discharge: 2010-10-16 | DRG: 390 | Disposition: A | Discharge status: Home / Self Care | Payer: Self-pay

## 2010-10-12 ENCOUNTER — Encounter (HOSPITAL_COMMUNITY): Payer: Self-pay

## 2010-10-12 ENCOUNTER — Emergency Department (HOSPITAL_COMMUNITY): Payer: Self-pay

## 2010-10-12 DIAGNOSIS — R109 Unspecified abdominal pain: Secondary | ICD-10-CM

## 2010-10-12 DIAGNOSIS — F3289 Other specified depressive episodes: Secondary | ICD-10-CM | POA: Diagnosis present

## 2010-10-12 DIAGNOSIS — R197 Diarrhea, unspecified: Secondary | ICD-10-CM

## 2010-10-12 DIAGNOSIS — K56609 Unspecified intestinal obstruction, unspecified as to partial versus complete obstruction: Principal | ICD-10-CM | POA: Diagnosis present

## 2010-10-12 DIAGNOSIS — F329 Major depressive disorder, single episode, unspecified: Secondary | ICD-10-CM | POA: Diagnosis present

## 2010-10-12 DIAGNOSIS — Z79899 Other long term (current) drug therapy: Secondary | ICD-10-CM

## 2010-10-12 DIAGNOSIS — R112 Nausea with vomiting, unspecified: Secondary | ICD-10-CM

## 2010-10-12 HISTORY — DX: Acquired absence of other specified parts of digestive tract: Z90.49

## 2010-10-12 LAB — CBC
MCV: 73.5 fL — ABNORMAL LOW (ref 78.0–100.0)
Platelets: 275 10*3/uL (ref 150–400)
RBC: 5.93 MIL/uL — ABNORMAL HIGH (ref 4.22–5.81)
WBC: 8.1 10*3/uL (ref 4.0–10.5)

## 2010-10-12 LAB — COMPREHENSIVE METABOLIC PANEL
ALT: 14 U/L (ref 0–53)
AST: 17 U/L (ref 0–37)
Alkaline Phosphatase: 86 U/L (ref 39–117)
CO2: 31 mEq/L (ref 19–32)
Chloride: 103 mEq/L (ref 96–112)
GFR calc non Af Amer: >90 mL/min (ref >90)
Potassium: 4.4 mEq/L (ref 3.5–5.1)
Sodium: 142 mEq/L (ref 135–145)
Total Bilirubin: 0.5 mg/dL (ref 0.3–1.2)

## 2010-10-12 LAB — URINALYSIS, ROUTINE W REFLEX MICROSCOPIC
Glucose, UA: NEGATIVE mg/dL
Hgb urine dipstick: NEGATIVE
Ketones, ur: 15 mg/dL — AB
Protein, ur: NEGATIVE mg/dL

## 2010-10-12 LAB — DIFFERENTIAL
Basophils Relative: 0 % (ref 0–1)
Eosinophils Absolute: 0 10*3/uL (ref 0.0–0.7)
Lymphs Abs: 0.6 10*3/uL — ABNORMAL LOW (ref 0.7–4.0)
Neutrophils Relative %: 88 % — ABNORMAL HIGH (ref 43–77)

## 2010-10-12 MED ORDER — IOHEXOL 300 MG/ML  SOLN
100.0000 mL | Freq: Once | INTRAMUSCULAR | Status: AC | PRN
  Administered 2010-10-12: 100 mL via INTRAVENOUS

## 2010-10-13 ENCOUNTER — Inpatient Hospital Stay (HOSPITAL_COMMUNITY): Payer: Self-pay

## 2010-10-13 LAB — BASIC METABOLIC PANEL
BUN: 9 mg/dL (ref 6–23)
Chloride: 104 mEq/L (ref 96–112)
Creatinine, Ser: 0.81 mg/dL (ref 0.50–1.35)
GFR calc Af Amer: >90 mL/min (ref >90)
Glucose, Bld: 105 mg/dL — ABNORMAL HIGH (ref 70–99)

## 2010-10-13 LAB — CBC
HCT: 38.4 % — ABNORMAL LOW (ref 39.0–52.0)
Hemoglobin: 12.5 g/dL — ABNORMAL LOW (ref 13.0–17.0)
MCHC: 32.6 g/dL (ref 30.0–36.0)
MCV: 74.3 fL — ABNORMAL LOW (ref 78.0–100.0)
RDW: 15.9 % — ABNORMAL HIGH (ref 11.5–15.5)
WBC: 4.8 10*3/uL (ref 4.0–10.5)
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
## 2011-06-08 ENCOUNTER — Emergency Department (INDEPENDENT_AMBULATORY_CARE_PROVIDER_SITE_OTHER): Payer: Self-pay

## 2011-06-08 ENCOUNTER — Encounter (HOSPITAL_COMMUNITY): Payer: Self-pay | Admitting: *Deleted

## 2011-06-08 ENCOUNTER — Emergency Department (INDEPENDENT_AMBULATORY_CARE_PROVIDER_SITE_OTHER)
Admission: EM | Admit: 2011-06-08 | Discharge: 2011-06-08 | Disposition: A | Discharge status: Home / Self Care | Payer: Self-pay | Source: Home / Self Care | Attending: Emergency Medicine | Admitting: Emergency Medicine

## 2011-06-08 DIAGNOSIS — S139XXA Sprain of joints and ligaments of unspecified parts of neck, initial encounter: Secondary | ICD-10-CM

## 2011-06-08 DIAGNOSIS — S5010XA Contusion of unspecified forearm, initial encounter: Secondary | ICD-10-CM

## 2011-06-08 DIAGNOSIS — S161XXA Strain of muscle, fascia and tendon at neck level, initial encounter: Secondary | ICD-10-CM

## 2011-06-08 DIAGNOSIS — S5012XA Contusion of left forearm, initial encounter: Secondary | ICD-10-CM

## 2011-06-08 MED ORDER — METHOCARBAMOL 500 MG PO TABS: 500.0000 mg | ORAL_TABLET | Freq: Three times a day (TID) | ORAL | Status: AC

## 2011-06-08 MED ORDER — MELOXICAM 15 MG PO TABS: 15.0000 mg | ORAL_TABLET | Freq: Every day | ORAL | Status: DC
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
## 2011-06-08 NOTE — Discharge Instructions (Signed)
Cervical Sprain A cervical sprain is an injury in the neck in which the ligaments are stretched or torn. The ligaments are the tissues that hold the bones of the neck (vertebrae) in place.Cervical sprains can range from very mild to very severe. Most cervical sprains get better in 1 to 3 weeks, but it depends on the cause and extent of the injury. Severe cervical sprains can cause the neck vertebrae to be unstable. This can lead to damage of the spinal cord and can result in serious nervous system problems. Your caregiver will determine whether your cervical sprain is mild or severe. CAUSES  Severe cervical sprains may be caused by:  Contact sport injuries (football, rugby, wrestling, hockey, auto racing, gymnastics, diving, martial arts, boxing).   Motor vehicle collisions.   Whiplash injuries. This means the neck is forcefully whipped backward and forward.   Falls.  Mild cervical sprains may be caused by:   Awkward positions, such as cradling a telephone between your ear and shoulder.   Sitting in a chair that does not offer proper support.   Working at a poorly designed computer station.   Activities that require looking up or down for long periods of time.  SYMPTOMS   Pain, soreness, stiffness, or a burning sensation in the front, back, or sides of the neck. This discomfort may develop immediately after injury or it may develop slowly and not begin for 24 hours or more after an injury.   Pain or tenderness directly in the middle of the back of the neck.   Shoulder or upper back pain.   Limited ability to move the neck.   Headache.   Dizziness.   Weakness, numbness, or tingling in the hands or arms.   Muscle spasms.   Difficulty swallowing or chewing.   Tenderness and swelling of the neck.  DIAGNOSIS  Most of the time, your caregiver can diagnose this problem by taking your history and doing a physical exam. Your caregiver will ask about any known problems, such as  arthritis in the neck or a previous neck injury. X-rays may be taken to find out if there are any other problems, such as problems with the bones of the neck. However, an X-ray often does not reveal the full extent of a cervical sprain. Other tests such as a computed tomography (CT) scan or magnetic resonance imaging (MRI) may be needed. TREATMENT  Treatment depends on the severity of the cervical sprain. Mild sprains can be treated with rest, keeping the neck in place (immobilization), and pain medicines. Severe cervical sprains need immediate immobilization and an appointment with an orthopedist or neurosurgeon. Several treatment options are available to help with pain, muscle spasms, and other symptoms. Your caregiver may prescribe:  Medicines, such as pain relievers, numbing medicines, or muscle relaxants.   Physical therapy. This can include stretching exercises, strengthening exercises, and posture training. Exercises and improved posture can help stabilize the neck, strengthen muscles, and help stop symptoms from returning.   A neck collar to be worn for short periods of time. Often, these collars are worn for comfort. However, certain collars may be worn to protect the neck and prevent further worsening of a serious cervical sprain.  HOME CARE INSTRUCTIONS   Put ice on the injured area.   Put ice in a plastic bag.   Place a towel between your skin and the bag.   Leave the ice on for 15 to 20 minutes, 3 to 4 times a day.     Only take over-the-counter or prescription medicines for pain, discomfort, or fever as directed by your caregiver.   Keep all follow-up appointments as directed by your caregiver.   Keep all physical therapy appointments as directed by your caregiver.   If a neck collar is prescribed, wear it as directed by your caregiver.   Do not drive while wearing a neck collar.   Make any needed adjustments to your work station to promote good posture.   Avoid positions  and activities that make your symptoms worse.   Warm up and stretch before being active to help prevent problems.  SEEK MEDICAL CARE IF:   Your pain is not controlled with medicine.   You are unable to decrease your pain medicine over time as planned.   Your activity level is not improving as expected.  SEEK IMMEDIATE MEDICAL CARE IF:   You develop any bleeding, stomach upset, or signs of an allergic reaction to your medicine.   Your symptoms get worse.   You develop new, unexplained symptoms.   You have numbness, tingling, weakness, or paralysis in any part of your body.  MAKE SURE YOU:   Understand these instructions.   Will watch your condition.   Will get help right away if you are not doing well or get worse.  Document Released: 10/18/2006 Document Revised: 12/10/2010 Document Reviewed: 09/23/2010 [employer] Patient Information 2012 [employer].Contusion A contusion is a deep bruise. Contusions are the result of an injury that caused bleeding under the skin. The contusion may turn blue, purple, or yellow. Minor injuries will give you a painless contusion, but more severe contusions may stay painful and swollen for a few weeks.  CAUSES  A contusion is usually caused by a blow, trauma, or direct force to an area of the body. SYMPTOMS   Swelling and redness of the injured area.   Bruising of the injured area.   Tenderness and soreness of the injured area.   Pain.  DIAGNOSIS  The diagnosis can be made by taking a history and physical exam. An X-ray, CT scan, or MRI may be needed to determine if there were any associated injuries, such as fractures. TREATMENT  Specific treatment will depend on what area of the body was injured. In general, the best treatment for a contusion is resting, icing, elevating, and applying cold compresses to the injured area. Over-the-counter medicines may also be recommended for pain control. Ask your caregiver what the best treatment is for  your contusion. HOME CARE INSTRUCTIONS   Put ice on the injured area.   Put ice in a plastic bag.   Place a towel between your skin and the bag.   Leave the ice on for 15 to 20 minutes, 3 to 4 times a day.   Only take over-the-counter or prescription medicines for pain, discomfort, or fever as directed by your caregiver. Your caregiver may recommend avoiding anti-inflammatory medicines (aspirin, ibuprofen, and naproxen) for 48 hours because these medicines may increase bruising.   Rest the injured area.   If possible, elevate the injured area to reduce swelling.  SEEK IMMEDIATE MEDICAL CARE IF:   You have increased bruising or swelling.   You have pain that is getting worse.   Your swelling or pain is not relieved with medicines.  MAKE SURE YOU:   Understand these instructions.   Will watch your condition.   Will get help right away if you are not doing well or get worse.  Document Released: 09/30/2004 Document Revised:   12/10/2010 Document Reviewed: 10/26/2010 [employer] Patient Information 2012 [employer]. 

## 2011-06-08 NOTE — ED Notes (Signed)
RN Alinda Money informed BP

## 2011-09-22 ENCOUNTER — Encounter (HOSPITAL_COMMUNITY): Payer: Self-pay | Admitting: *Deleted

## 2011-09-22 ENCOUNTER — Emergency Department (HOSPITAL_COMMUNITY): Payer: Self-pay

## 2011-09-22 ENCOUNTER — Inpatient Hospital Stay (HOSPITAL_COMMUNITY)
Admission: EM | Admit: 2011-09-22 | Discharge: 2011-09-25 | DRG: 390 | Disposition: A | Discharge status: Home / Self Care | Payer: MEDICAID | Attending: General Surgery | Admitting: General Surgery

## 2011-09-22 DIAGNOSIS — R112 Nausea with vomiting, unspecified: Secondary | ICD-10-CM

## 2011-09-22 DIAGNOSIS — R109 Unspecified abdominal pain: Secondary | ICD-10-CM

## 2011-09-22 DIAGNOSIS — Z9089 Acquired absence of other organs: Secondary | ICD-10-CM

## 2011-09-22 DIAGNOSIS — K56609 Unspecified intestinal obstruction, unspecified as to partial versus complete obstruction: Secondary | ICD-10-CM

## 2011-09-22 DIAGNOSIS — Z87891 Personal history of nicotine dependence: Secondary | ICD-10-CM

## 2011-09-22 HISTORY — DX: Unspecified intestinal obstruction, unspecified as to partial versus complete obstruction: K56.609

## 2011-09-22 HISTORY — DX: Accidental discharge from unspecified firearms or gun, initial encounter: W34.00XA

## 2011-09-22 LAB — URINALYSIS, ROUTINE W REFLEX MICROSCOPIC
Bilirubin Urine: NEGATIVE
Glucose, UA: NEGATIVE mg/dL
Hgb urine dipstick: NEGATIVE
Ketones, ur: NEGATIVE mg/dL
Protein, ur: NEGATIVE mg/dL
Urobilinogen, UA: 1 mg/dL (ref 0.0–1.0)

## 2011-09-22 LAB — COMPREHENSIVE METABOLIC PANEL
ALT: 16 U/L (ref 0–53)
BUN: 9 mg/dL (ref 6–23)
CO2: 28 mEq/L (ref 19–32)
Calcium: 10 mg/dL (ref 8.4–10.5)
Creatinine, Ser: 0.97 mg/dL (ref 0.50–1.35)
GFR calc Af Amer: >90 mL/min (ref >90)
GFR calc non Af Amer: >90 mL/min (ref >90)
Glucose, Bld: 101 mg/dL — ABNORMAL HIGH (ref 70–99)
Total Protein: 7.4 g/dL (ref 6.0–8.3)

## 2011-09-22 LAB — CBC WITH DIFFERENTIAL/PLATELET
Basophils Relative: 0 % (ref 0–1)
Eosinophils Absolute: 0.3 10*3/uL (ref 0.0–0.7)
Eosinophils Relative: 5 % (ref 0–5)
Hemoglobin: 14.7 g/dL (ref 13.0–17.0)
Lymphs Abs: 1.4 10*3/uL (ref 0.7–4.0)
MCH: 26.6 pg (ref 26.0–34.0)
MCHC: 33.3 g/dL (ref 30.0–36.0)
MCV: 80.1 fL (ref 78.0–100.0)
Monocytes Absolute: 0.6 10*3/uL (ref 0.1–1.0)
Monocytes Relative: 9 % (ref 3–12)
Neutrophils Relative %: 68 % (ref 43–77)

## 2011-09-22 MED ORDER — HYDROMORPHONE HCL PF 1 MG/ML IJ SOLN
0.5000 mg | INTRAMUSCULAR | Status: DC | PRN
  Administered 2011-09-22 – 2011-09-23 (×3): 1 mg via INTRAVENOUS
  Filled 2011-09-22 (×3): qty 1

## 2011-09-22 MED ORDER — PROMETHAZINE HCL 25 MG/ML IJ SOLN
12.5000 mg | Freq: Four times a day (QID) | INTRAMUSCULAR | Status: DC | PRN
  Administered 2011-09-22: 12.5 mg via INTRAVENOUS
  Filled 2011-09-22: qty 1

## 2011-09-22 MED ORDER — ONDANSETRON HCL 4 MG/2ML IJ SOLN
4.0000 mg | INTRAMUSCULAR | Status: AC
  Administered 2011-09-22: 4 mg via INTRAVENOUS
  Filled 2011-09-22: qty 2

## 2011-09-22 MED ORDER — KCL IN DEXTROSE-NACL 20-5-0.45 MEQ/L-%-% IV SOLN
INTRAVENOUS | Status: DC
  Administered 2011-09-22 – 2011-09-23 (×4): via INTRAVENOUS
  Administered 2011-09-24: 20 mL/h via INTRAVENOUS
  Filled 2011-09-22 (×8): qty 1000

## 2011-09-22 MED ORDER — LORAZEPAM 2 MG/ML IJ SOLN
INTRAMUSCULAR | Status: AC
  Administered 2011-09-22: 1 mg
  Filled 2011-09-22: qty 1

## 2011-09-22 MED ORDER — SODIUM CHLORIDE 0.9 % IV SOLN
Freq: Once | INTRAVENOUS | Status: AC
  Administered 2011-09-22: 07:00:00 via INTRAVENOUS

## 2011-09-22 MED ORDER — FENTANYL CITRATE 0.05 MG/ML IJ SOLN
INTRAMUSCULAR | Status: AC
  Administered 2011-09-22: 50 ug via INTRAVENOUS
  Filled 2011-09-22: qty 2

## 2011-09-22 MED ORDER — ONDANSETRON HCL 4 MG/2ML IJ SOLN
4.0000 mg | Freq: Once | INTRAMUSCULAR | Status: AC
  Administered 2011-09-22 (×2): 4 mg via INTRAVENOUS
  Filled 2011-09-22: qty 2

## 2011-09-22 MED ORDER — ENOXAPARIN SODIUM 40 MG/0.4ML ~~LOC~~ SOLN
40.0000 mg | SUBCUTANEOUS | Status: DC
  Administered 2011-09-22 – 2011-09-24 (×3): 40 mg via SUBCUTANEOUS
  Filled 2011-09-22 (×4): qty 0.4

## 2011-09-22 MED ORDER — HYDROMORPHONE HCL PF 1 MG/ML IJ SOLN
1.0000 mg | Freq: Once | INTRAMUSCULAR | Status: AC
  Administered 2011-09-22: 1 mg via INTRAVENOUS
  Filled 2011-09-22: qty 1

## 2011-09-22 MED ORDER — ONDANSETRON HCL 4 MG/2ML IJ SOLN
INTRAMUSCULAR | Status: AC
  Administered 2011-09-22: 4 mg via INTRAVENOUS
  Filled 2011-09-22: qty 2

## 2011-09-22 MED ORDER — LACTATED RINGERS IV BOLUS (SEPSIS)
1000.0000 mL | Freq: Once | INTRAVENOUS | Status: AC
  Administered 2011-09-22: 1000 mL via INTRAVENOUS

## 2011-09-22 MED ORDER — ONDANSETRON HCL 4 MG/2ML IJ SOLN
4.0000 mg | Freq: Four times a day (QID) | INTRAMUSCULAR | Status: DC | PRN
  Administered 2011-09-22 (×2): 4 mg via INTRAVENOUS
  Filled 2011-09-22 (×2): qty 2

## 2011-09-22 MED ORDER — FENTANYL CITRATE 0.05 MG/ML IJ SOLN
50.0000 ug | Freq: Once | INTRAMUSCULAR | Status: AC
  Administered 2011-09-22: 50 ug via INTRAVENOUS

## 2011-09-22 NOTE — ED Notes (Signed)
Xray results reviewed, CN made aware. No changes, calm, NAD, interactive.
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
## 2011-09-22 NOTE — ED Notes (Signed)
Surgery at bedside for consult

## 2011-09-22 NOTE — ED Notes (Signed)
sittin gin w/c at triage, "feels a little better", nausea decreased, abd pain remains.

## 2011-09-22 NOTE — ED Notes (Addendum)
16 French NG tube in place through the right nostril.  The second line is even with the entry to the right nare.  Wall suction set to low-intermittent.
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
## 2011-09-23 ENCOUNTER — Inpatient Hospital Stay (HOSPITAL_COMMUNITY): Payer: Self-pay

## 2011-09-23 LAB — CBC
HCT: 37 % — ABNORMAL LOW (ref 39.0–52.0)
Platelets: 166 10*3/uL (ref 150–400)
RDW: 14.8 % (ref 11.5–15.5)
WBC: 7.9 10*3/uL (ref 4.0–10.5)

## 2011-09-23 LAB — BASIC METABOLIC PANEL
Chloride: 105 mEq/L (ref 96–112)
Creatinine, Ser: 0.96 mg/dL (ref 0.50–1.35)
GFR calc Af Amer: >90 mL/min (ref >90)
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
## 2011-09-23 NOTE — Progress Notes (Signed)
UR completed 
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
## 2011-09-24 ENCOUNTER — Inpatient Hospital Stay (HOSPITAL_COMMUNITY): Payer: MEDICAID

## 2011-09-24 MED ORDER — POLYETHYLENE GLYCOL 3350 17 G PO PACK
17.0000 g | PACK | Freq: Every day | ORAL | Status: DC
  Administered 2011-09-24: 17 g via ORAL
  Filled 2011-09-24 (×2): qty 1

## 2011-09-24 MED ORDER — DOCUSATE SODIUM 100 MG PO CAPS: 100.0000 mg | ORAL_CAPSULE | Freq: Two times a day (BID) | ORAL | Status: DC | PRN

## 2011-09-24 NOTE — Progress Notes (Signed)
Looks good. Tolerating liquids. No n/v. +flatus/bm  Adv this pm Home in AM  [place]. Andrey Campanile, MD, FACS General, Bariatric, & Minimally Invasive Surgery [facility], [place]
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
## 2011-09-25 NOTE — Discharge Summary (Signed)
I have personally interviewed and examined this patient this morning prior to discharge. I agree with the evaluation, treatment plan, and discharge followup as outlined by Mr. Golda Acre.  Angelia Mould. Derrell Lolling, M.D., [facility] General and Minimally invasive Surgery Breast and Colorectal Surgery Office:   [phone] Pager:   [phone]
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
## 2011-09-30 ENCOUNTER — Telehealth (INDEPENDENT_AMBULATORY_CARE_PROVIDER_SITE_OTHER): Payer: Self-pay

## 2011-09-30 ENCOUNTER — Telehealth (HOSPITAL_COMMUNITY): Payer: Self-pay | Admitting: Orthopedic Surgery

## 2011-09-30 NOTE — Telephone Encounter (Signed)
Pt called re:rtw status. Pt advised he will need to call the trauma service and was given # to reach them.

## 2011-10-01 ENCOUNTER — Encounter: Payer: Self-pay | Admitting: Orthopedic Surgery

## 2011-10-01 NOTE — Telephone Encounter (Signed)
Needed letter to return to work.

## 2012-06-15 ENCOUNTER — Encounter (HOSPITAL_COMMUNITY): Payer: Self-pay | Admitting: Emergency Medicine

## 2012-06-15 ENCOUNTER — Emergency Department (INDEPENDENT_AMBULATORY_CARE_PROVIDER_SITE_OTHER): Payer: Self-pay

## 2012-06-15 ENCOUNTER — Emergency Department (INDEPENDENT_AMBULATORY_CARE_PROVIDER_SITE_OTHER)
Admission: EM | Admit: 2012-06-15 | Discharge: 2012-06-15 | Disposition: A | Discharge status: Home / Self Care | Payer: Self-pay | Source: Home / Self Care | Attending: Emergency Medicine | Admitting: Emergency Medicine

## 2012-06-15 DIAGNOSIS — M795 Residual foreign body in soft tissue: Secondary | ICD-10-CM

## 2012-06-15 DIAGNOSIS — M545 Low back pain, unspecified: Secondary | ICD-10-CM

## 2012-06-15 MED ORDER — DICLOFENAC SODIUM 75 MG PO TBEC: 75.0000 mg | DELAYED_RELEASE_TABLET | Freq: Two times a day (BID) | ORAL | Status: DC
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
## 2012-06-21 ENCOUNTER — Telehealth (HOSPITAL_COMMUNITY): Payer: Self-pay | Admitting: Emergency Medicine
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
## 2012-06-30 ENCOUNTER — Encounter (INDEPENDENT_AMBULATORY_CARE_PROVIDER_SITE_OTHER): Payer: Self-pay

## 2012-06-30 ENCOUNTER — Telehealth (INDEPENDENT_AMBULATORY_CARE_PROVIDER_SITE_OTHER): Payer: Self-pay | Admitting: General Surgery

## 2012-06-30 NOTE — Telephone Encounter (Signed)
I called all numbers listed, not successful in contacting patient for reason of NS in today's Trauma Clinic.

## 2012-10-09 ENCOUNTER — Encounter (HOSPITAL_COMMUNITY): Payer: Self-pay | Admitting: *Deleted

## 2012-10-09 ENCOUNTER — Emergency Department (HOSPITAL_COMMUNITY)
Admission: EM | Admit: 2012-10-09 | Discharge: 2012-10-09 | Disposition: A | Discharge status: Home / Self Care | Payer: No Typology Code available for payment source | Attending: Emergency Medicine | Admitting: Emergency Medicine

## 2012-10-09 ENCOUNTER — Emergency Department (HOSPITAL_COMMUNITY): Payer: No Typology Code available for payment source

## 2012-10-09 DIAGNOSIS — Z87891 Personal history of nicotine dependence: Secondary | ICD-10-CM | POA: Insufficient documentation

## 2012-10-09 DIAGNOSIS — Y9389 Activity, other specified: Secondary | ICD-10-CM | POA: Insufficient documentation

## 2012-10-09 DIAGNOSIS — R109 Unspecified abdominal pain: Secondary | ICD-10-CM

## 2012-10-09 DIAGNOSIS — Z8719 Personal history of other diseases of the digestive system: Secondary | ICD-10-CM | POA: Insufficient documentation

## 2012-10-09 DIAGNOSIS — Z9089 Acquired absence of other organs: Secondary | ICD-10-CM | POA: Insufficient documentation

## 2012-10-09 DIAGNOSIS — S3981XA Other specified injuries of abdomen, initial encounter: Secondary | ICD-10-CM | POA: Insufficient documentation

## 2012-10-09 DIAGNOSIS — R1031 Right lower quadrant pain: Secondary | ICD-10-CM | POA: Insufficient documentation

## 2012-10-09 DIAGNOSIS — R110 Nausea: Secondary | ICD-10-CM | POA: Insufficient documentation

## 2012-10-09 DIAGNOSIS — Y92410 Unspecified street and highway as the place of occurrence of the external cause: Secondary | ICD-10-CM | POA: Insufficient documentation

## 2012-10-09 LAB — CBC WITH DIFFERENTIAL/PLATELET
Basophils Absolute: 0 10*3/uL (ref 0.0–0.1)
Eosinophils Relative: 7 % — ABNORMAL HIGH (ref 0–5)
HCT: 43.3 % (ref 39.0–52.0)
Hemoglobin: 14.7 g/dL (ref 13.0–17.0)
Lymphocytes Relative: 29 % (ref 12–46)
Lymphs Abs: 1.5 10*3/uL (ref 0.7–4.0)
MCV: 79.4 fL (ref 78.0–100.0)
Monocytes Absolute: 0.5 10*3/uL (ref 0.1–1.0)
Monocytes Relative: 9 % (ref 3–12)
Neutro Abs: 2.8 10*3/uL (ref 1.7–7.7)
RDW: 13.8 % (ref 11.5–15.5)
WBC: 5.1 10*3/uL (ref 4.0–10.5)

## 2012-10-09 LAB — COMPREHENSIVE METABOLIC PANEL
Albumin: 4.3 g/dL (ref 3.5–5.2)
Alkaline Phosphatase: 62 U/L (ref 39–117)
BUN: 12 mg/dL (ref 6–23)
Calcium: 9.7 mg/dL (ref 8.4–10.5)
GFR calc Af Amer: >90 mL/min (ref >90)
Potassium: 3.7 mEq/L (ref 3.5–5.1)
Sodium: 141 mEq/L (ref 135–145)
Total Protein: 7.9 g/dL (ref 6.0–8.3)

## 2012-10-09 LAB — URINALYSIS, ROUTINE W REFLEX MICROSCOPIC
Nitrite: NEGATIVE
Protein, ur: NEGATIVE mg/dL
Specific Gravity, Urine: 1.042 — ABNORMAL HIGH (ref 1.005–1.030)
Urobilinogen, UA: 1 mg/dL (ref 0.0–1.0)

## 2012-10-09 MED ORDER — IOHEXOL 300 MG/ML  SOLN
100.0000 mL | Freq: Once | INTRAMUSCULAR | Status: AC | PRN
  Administered 2012-10-09: 100 mL via INTRAVENOUS

## 2012-10-09 MED ORDER — ONDANSETRON 8 MG PO TBDP: 8.0000 mg | ORAL_TABLET | Freq: Three times a day (TID) | ORAL | Status: DC | PRN

## 2012-10-09 MED ORDER — HYDROCODONE-ACETAMINOPHEN 5-325 MG PO TABS: 1.0000 | ORAL_TABLET | ORAL | Status: DC | PRN

## 2012-10-09 MED ORDER — HYDROMORPHONE HCL PF 1 MG/ML IJ SOLN
1.0000 mg | Freq: Once | INTRAMUSCULAR | Status: AC
  Administered 2012-10-09: 1 mg via INTRAVENOUS
  Filled 2012-10-09: qty 1

## 2012-10-09 MED ORDER — ONDANSETRON 4 MG PO TBDP
8.0000 mg | ORAL_TABLET | Freq: Once | ORAL | Status: AC
  Administered 2012-10-09: 8 mg via ORAL
  Filled 2012-10-09: qty 2

## 2012-10-09 NOTE — ED Notes (Signed)
Patient transported to CT 
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
## 2012-10-09 NOTE — ED Notes (Signed)
Pt sitting up in bed talking with family at bedside.

## 2012-10-09 NOTE — ED Notes (Signed)
PA at bedside for evaluation
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
## 2012-10-09 NOTE — ED Notes (Signed)
Pt dc to home with family. Pt sts understanding to dc instructions.  Pt ambulatory to exit without difficulty.  Pt denies need for w/c. 

## 2012-10-09 NOTE — ED Notes (Signed)
PA at bedside.

## 2012-10-09 NOTE — ED Notes (Signed)
Pt returned from CT scan.

## 2012-10-09 NOTE — ED Notes (Signed)
Pt undressed, in gown and resting on stretcher
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
## 2012-11-09 ENCOUNTER — Other Ambulatory Visit: Payer: Self-pay

## 2017-07-18 ENCOUNTER — Other Ambulatory Visit: Payer: Self-pay

## 2017-07-18 ENCOUNTER — Encounter (HOSPITAL_COMMUNITY): Payer: Self-pay | Admitting: Emergency Medicine

## 2017-07-18 ENCOUNTER — Emergency Department (HOSPITAL_COMMUNITY)
Admission: EM | Admit: 2017-07-18 | Discharge: 2017-07-18 | Disposition: A | Discharge status: Home / Self Care | Payer: No Typology Code available for payment source | Attending: Emergency Medicine | Admitting: Emergency Medicine

## 2017-07-18 DIAGNOSIS — Y9389 Activity, other specified: Secondary | ICD-10-CM | POA: Diagnosis not present

## 2017-07-18 DIAGNOSIS — Z79899 Other long term (current) drug therapy: Secondary | ICD-10-CM | POA: Diagnosis not present

## 2017-07-18 DIAGNOSIS — S3992XA Unspecified injury of lower back, initial encounter: Secondary | ICD-10-CM | POA: Diagnosis present

## 2017-07-18 DIAGNOSIS — S39012A Strain of muscle, fascia and tendon of lower back, initial encounter: Secondary | ICD-10-CM | POA: Diagnosis not present

## 2017-07-18 DIAGNOSIS — M545 Low back pain, unspecified: Secondary | ICD-10-CM

## 2017-07-18 DIAGNOSIS — T148XXA Other injury of unspecified body region, initial encounter: Secondary | ICD-10-CM

## 2017-07-18 DIAGNOSIS — Z87891 Personal history of nicotine dependence: Secondary | ICD-10-CM | POA: Insufficient documentation

## 2017-07-18 DIAGNOSIS — Y998 Other external cause status: Secondary | ICD-10-CM | POA: Diagnosis not present

## 2017-07-18 DIAGNOSIS — Y929 Unspecified place or not applicable: Secondary | ICD-10-CM | POA: Diagnosis not present

## 2017-07-18 MED ORDER — NAPROXEN 500 MG PO TABS: 500.0000 mg | ORAL_TABLET | Freq: Two times a day (BID) | ORAL | 0 refills | Status: DC
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
## 2017-07-18 NOTE — ED Notes (Signed)
See provider assessment 
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
## 2017-07-18 NOTE — Discharge Instructions (Signed)
Take naproxen 2 times a day with meals.  Do not take other anti-inflammatories at the same time open (Advil, Motrin, ibuprofen, Aleve). You may supplement with Tylenol if you need further pain control. Use muscle creams to help with pain and swelling.  Use ice packs or heating pads if this helps control your pain. Try back stretches to relax your muscles.  You will likely have continued muscle stiffness and soreness over the next couple days.   Return to the emergency room if you develop vision changes, vomiting, slurred speech, numbness, loss of bowel or bladder control, or any new or worsening symptoms.

## 2017-12-14 ENCOUNTER — Ambulatory Visit (HOSPITAL_COMMUNITY)
Admission: EM | Admit: 2017-12-14 | Discharge: 2017-12-14 | Disposition: A | Discharge status: Home / Self Care | Payer: Self-pay | Attending: Emergency Medicine | Admitting: Emergency Medicine

## 2017-12-14 ENCOUNTER — Encounter (HOSPITAL_COMMUNITY): Payer: Self-pay

## 2017-12-14 DIAGNOSIS — Z87891 Personal history of nicotine dependence: Secondary | ICD-10-CM | POA: Insufficient documentation

## 2017-12-14 DIAGNOSIS — B9789 Other viral agents as the cause of diseases classified elsewhere: Secondary | ICD-10-CM | POA: Insufficient documentation

## 2017-12-14 DIAGNOSIS — Z113 Encounter for screening for infections with a predominantly sexual mode of transmission: Secondary | ICD-10-CM

## 2017-12-14 DIAGNOSIS — Z885 Allergy status to narcotic agent status: Secondary | ICD-10-CM | POA: Insufficient documentation

## 2017-12-14 DIAGNOSIS — N489 Disorder of penis, unspecified: Secondary | ICD-10-CM

## 2017-12-14 DIAGNOSIS — N4889 Other specified disorders of penis: Secondary | ICD-10-CM | POA: Insufficient documentation

## 2017-12-14 DIAGNOSIS — N342 Other urethritis: Secondary | ICD-10-CM

## 2017-12-14 LAB — POCT URINALYSIS DIP (DEVICE)
Bilirubin Urine: NEGATIVE
Glucose, UA: NEGATIVE mg/dL
Ketones, ur: NEGATIVE mg/dL
Nitrite: NEGATIVE
Protein, ur: NEGATIVE mg/dL
Specific Gravity, Urine: 1.015 (ref 1.005–1.030)
Urobilinogen, UA: 1 mg/dL (ref 0.0–1.0)
pH: 7 (ref 5.0–8.0)

## 2017-12-14 MED ORDER — AZITHROMYCIN 250 MG PO TABS
1000.0000 mg | ORAL_TABLET | Freq: Once | ORAL | Status: AC
  Administered 2017-12-14: 1000 mg via ORAL

## 2017-12-14 MED ORDER — CEFTRIAXONE SODIUM 250 MG IJ SOLR
INTRAMUSCULAR | Status: AC
  Filled 2017-12-14: qty 250

## 2017-12-14 MED ORDER — AZITHROMYCIN 250 MG PO TABS
ORAL_TABLET | ORAL | Status: AC
  Filled 2017-12-14: qty 1

## 2017-12-14 MED ORDER — CEFTRIAXONE SODIUM 250 MG IJ SOLR
250.0000 mg | Freq: Once | INTRAMUSCULAR | Status: AC
  Administered 2017-12-14: 250 mg via INTRAMUSCULAR

## 2017-12-14 MED ORDER — LIDOCAINE HCL (PF) 1 % IJ SOLN
INTRAMUSCULAR | Status: AC
  Filled 2017-12-14: qty 2

## 2017-12-14 MED ORDER — ACYCLOVIR 400 MG PO TABS: 400.0000 mg | ORAL_TABLET | Freq: Three times a day (TID) | ORAL | 1 refills | Status: AC

## 2017-12-14 NOTE — Discharge Instructions (Addendum)
This looks like this could be herpes, however have sent off a culture to see if it is.  In the meantime I am Starting you on acyclovir to lessen the duration and severity of the symptoms.  If this comes back, start taking the acyclovir 400 mg by mouth 3 times a day for 7 days.  I am giving you a refill so that you have plenty in case this comes back.  We will call you if any of your labs come back positive, however feel free to call here and get your labs in about 3 days.  Do not have intercourse until you know what your results are and your symptoms have completely resolved.  Go to www.goodrx.com to look up your medications. This will give you a list of where you can find your prescriptions at the most affordable prices.    If you have no primary doctor, here are some resources that may be helpful:  - [facility]: [address] [place], [place] [address] [place], [place] [address] [phone]  Medicaid-accepting [facility] Providers: - [facility]- [address]  [phone];   - [facility]- [address] [place], [address] [phone]  - [facility]- [address], [address] [phone]  [facility]- [address] [phone]  - Renaye Rakers- [address], [address] [phone]. Only accepts [place] patients after they have her name applied to their card  -Dr. Greggory Stallion Osei-Bonsu, [facility]. [address]    [place], [place] [address]  [phone]  Self Pay (no insurance) in [place]: - Sickle Cell Patients: Dr Willey Blade, [facility] [address] [place] [phone]  - [phone]  - Physician Referral Service- [phone]  - [facility]- [address], [place], [phone];  Monday to Friday, 9 a.m. - 7 p.m.; Saturday 9 a.m. to 1  p.m.  [facility]- [address] [place], [place] [phone]  - [facility]- [address] [phone] [facility]- [address] [phone] [facility], [address], [place]; [phone]; or [address], [place]; [phone].   [facility] [place], [place]. [address] [place]; [phone]; Monday to Wednesday, 8:30 a.m. - 5 p.m.; Thursday, 8:30 a.m. - 8 p.m.  [facility], [address], [place]; [phone]; Monday to Friday, 8 a.m. - 4:30 p.m.   [facility], [place] [address], [place], [phone]; first and third Saturday of the month, 9:30 a.m. - 12:30 p.m.  [facility], [address] [place], [phone]; second Saturday of the month, 9 a.m. -noon.  [facility] for children. For information, call [phone]; [phone]; or [phone].  Other agencies that provide inexpensive medical care:     [facility]  [phone]    [facility]  [phone]    [facility]  [phone] [address] [place] [address]    [facility]  [phone]    [facility]  [phone], [phone]; or [phone].  Chronic Pain Problems Contact [facility]  [phone] Patients need to be referred by their primary care doctor.  [facility] [address]                          [facility] [address] [place]                       [address]      [address]   [phone] (After Hours)  General Information: Finding a doctor when you do not have health insurance can be tricky. Although you are not limited by an insurance plan, you are of course limited by her finances and how much but he can pay out of pocket.  What are your options if you don't have health  insurance?   1) Find a Librarian, academicDoctor and Pay Out of Pocket Although you won't have to find out who is covered by your insurance plan, it is a good idea to ask around and get recommendations. You will then need to call the office and see if the doctor you have chosen will accept you as a new patient and what types of options they offer for patients who are self-pay. Some doctors offer discounts or will set up payment plans for their patients who do not have insurance, but you will need to ask so you aren't surprised when you get to your appointment.  2) Contact Your Local Health Department Not all health departments have doctors that can see patients for sick visits, but many do, so it is worth a call to see if yours does. If you don't know where your local health department is, you can check in your phone book. The CDC also has a tool to help you locate your state's health department, and many state websites also have listings of all of their local health departments.  3) Find a Walk-in Clinic If your illness is not likely to be very severe or complicated, you may want to try a walk in clinic. These are popping up all over the country in pharmacies, drugstores, and shopping centers. They're usually staffed by nurse practitioners or physician assistants that have been trained to treat common illnesses and complaints. They're usually fairly quick and inexpensive. However, if you have serious medical issues or chronic medical problems, these are probably not your best option
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
## 2017-12-14 NOTE — ED Triage Notes (Signed)
Pt presents with penile discharge, irritation and rash.

## 2017-12-15 LAB — URINE CYTOLOGY ANCILLARY ONLY
CHLAMYDIA, DNA PROBE: NEGATIVE
Neisseria Gonorrhea: NEGATIVE
Trichomonas: NEGATIVE

## 2017-12-15 LAB — URINE CULTURE: Culture: NO GROWTH

## 2017-12-15 LAB — RPR: RPR Ser Ql: NONREACTIVE

## 2017-12-15 LAB — HIV ANTIBODY (ROUTINE TESTING W REFLEX): HIV Screen 4th Generation wRfx: NONREACTIVE

## 2017-12-16 LAB — HSV CULTURE AND TYPING

## 2017-12-17 ENCOUNTER — Telehealth (HOSPITAL_COMMUNITY): Payer: Self-pay | Admitting: Emergency Medicine

## 2017-12-17 NOTE — Telephone Encounter (Signed)
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 Called with patient and spoke with him extensively. Education and safe sex practices given. Encouraged to find a PCP. Pt was upset, stated he had nobody to talk to about it, gave him warm line helpline to talk to someone. Pt denies SI/Hi, just needed someone to talk to. Patient was very appreciative, all questions answered.

## 2018-07-27 ENCOUNTER — Encounter (HOSPITAL_COMMUNITY)
Admission: EM | Disposition: A | Discharge status: Home / Self Care | Payer: Self-pay | Source: Home / Self Care | Attending: Emergency Medicine

## 2018-07-27 ENCOUNTER — Encounter (HOSPITAL_COMMUNITY): Payer: Self-pay

## 2018-07-27 ENCOUNTER — Emergency Department (HOSPITAL_COMMUNITY): Payer: Self-pay | Admitting: Certified Registered Nurse Anesthetist

## 2018-07-27 ENCOUNTER — Ambulatory Visit (HOSPITAL_COMMUNITY)
Admission: EM | Admit: 2018-07-27 | Discharge: 2018-07-27 | Disposition: A | Discharge status: Home / Self Care | Payer: Self-pay | Attending: Otolaryngology | Admitting: Otolaryngology

## 2018-07-27 ENCOUNTER — Emergency Department (HOSPITAL_COMMUNITY): Payer: Self-pay

## 2018-07-27 ENCOUNTER — Other Ambulatory Visit: Payer: Self-pay

## 2018-07-27 DIAGNOSIS — S161XXA Strain of muscle, fascia and tendon at neck level, initial encounter: Secondary | ICD-10-CM

## 2018-07-27 DIAGNOSIS — S0083XA Contusion of other part of head, initial encounter: Secondary | ICD-10-CM

## 2018-07-27 DIAGNOSIS — Z1159 Encounter for screening for other viral diseases: Secondary | ICD-10-CM | POA: Insufficient documentation

## 2018-07-27 DIAGNOSIS — E860 Dehydration: Secondary | ICD-10-CM | POA: Insufficient documentation

## 2018-07-27 DIAGNOSIS — S0191XA Laceration without foreign body of unspecified part of head, initial encounter: Secondary | ICD-10-CM

## 2018-07-27 DIAGNOSIS — S0990XA Unspecified injury of head, initial encounter: Secondary | ICD-10-CM

## 2018-07-27 DIAGNOSIS — S02642A Fracture of ramus of left mandible, initial encounter for closed fracture: Secondary | ICD-10-CM

## 2018-07-27 DIAGNOSIS — S02612A Fracture of condylar process of left mandible, initial encounter for closed fracture: Secondary | ICD-10-CM | POA: Insufficient documentation

## 2018-07-27 DIAGNOSIS — S0181XA Laceration without foreign body of other part of head, initial encounter: Secondary | ICD-10-CM | POA: Insufficient documentation

## 2018-07-27 DIAGNOSIS — S022XXA Fracture of nasal bones, initial encounter for closed fracture: Secondary | ICD-10-CM | POA: Insufficient documentation

## 2018-07-27 HISTORY — PX: ORIF MANDIBULAR FRACTURE: SHX2127

## 2018-07-27 LAB — COMPREHENSIVE METABOLIC PANEL
ALT: 38 U/L (ref 0–44)
AST: 49 U/L — ABNORMAL HIGH (ref 15–41)
Albumin: 3.5 g/dL (ref 3.5–5.0)
Alkaline Phosphatase: 66 U/L (ref 38–126)
Anion gap: 12 (ref 5–15)
BUN: 13 mg/dL (ref 6–20)
CO2: 22 mmol/L (ref 22–32)
Calcium: 8.8 mg/dL — ABNORMAL LOW (ref 8.9–10.3)
Chloride: 107 mmol/L (ref 98–111)
Creatinine, Ser: 1.37 mg/dL — ABNORMAL HIGH (ref 0.61–1.24)
Glucose, Bld: 131 mg/dL — ABNORMAL HIGH (ref 70–99)
Potassium: 3.3 mmol/L — ABNORMAL LOW (ref 3.5–5.1)
Sodium: 141 mmol/L (ref 135–145)
Total Bilirubin: 0.3 mg/dL (ref 0.3–1.2)
Total Protein: 6.8 g/dL (ref 6.5–8.1)

## 2018-07-27 LAB — I-STAT CHEM 8, ED
BUN: 13 mg/dL (ref 6–20)
Calcium, Ion: 0.99 mmol/L — ABNORMAL LOW (ref 1.15–1.40)
Chloride: 105 mmol/L (ref 98–111)
Creatinine, Ser: 1.3 mg/dL — ABNORMAL HIGH (ref 0.61–1.24)
Glucose, Bld: 127 mg/dL — ABNORMAL HIGH (ref 70–99)
HCT: 43 % (ref 39.0–52.0)
Hemoglobin: 14.6 g/dL (ref 13.0–17.0)
Potassium: 3.2 mmol/L — ABNORMAL LOW (ref 3.5–5.1)
Sodium: 140 mmol/L (ref 135–145)
TCO2: 23 mmol/L (ref 22–32)

## 2018-07-27 LAB — CBC
HCT: 42.8 % (ref 39.0–52.0)
Hemoglobin: 13.4 g/dL (ref 13.0–17.0)
MCH: 26 pg (ref 26.0–34.0)
MCHC: 31.3 g/dL (ref 30.0–36.0)
MCV: 83.1 fL (ref 80.0–100.0)
Platelets: 232 10*3/uL (ref 150–400)
RBC: 5.15 MIL/uL (ref 4.22–5.81)
RDW: 14.5 % (ref 11.5–15.5)
WBC: 17.4 10*3/uL — ABNORMAL HIGH (ref 4.0–10.5)
nRBC: 0 % (ref 0.0–0.2)

## 2018-07-27 LAB — PROTIME-INR
INR: 1 (ref 0.8–1.2)
Prothrombin Time: 13.3 seconds (ref 11.4–15.2)

## 2018-07-27 LAB — SARS CORONAVIRUS 2 BY RT PCR (HOSPITAL ORDER, PERFORMED IN ~~LOC~~ HOSPITAL LAB): SARS Coronavirus 2: NEGATIVE

## 2018-07-27 LAB — LACTIC ACID, PLASMA: Lactic Acid, Venous: 3.9 mmol/L (ref 0.5–1.9)

## 2018-07-27 LAB — SAMPLE TO BLOOD BANK

## 2018-07-27 LAB — CDS SEROLOGY

## 2018-07-27 LAB — ETHANOL: Alcohol, Ethyl (B): <10 mg/dL (ref <10)

## 2018-07-27 SURGERY — OPEN REDUCTION INTERNAL FIXATION (ORIF) MANDIBULAR FRACTURE
Anesthesia: General | Site: Mouth

## 2018-07-27 MED ORDER — BACITRACIN ZINC 500 UNIT/GM EX OINT
TOPICAL_OINTMENT | CUTANEOUS | Status: AC
  Filled 2018-07-27: qty 28.35

## 2018-07-27 MED ORDER — LIDOCAINE-EPINEPHRINE 1 %-1:100000 IJ SOLN
INTRAMUSCULAR | Status: AC
  Filled 2018-07-27: qty 1

## 2018-07-27 MED ORDER — ONDANSETRON HCL 4 MG/2ML IJ SOLN
INTRAMUSCULAR | Status: DC | PRN
  Administered 2018-07-27: 4 mg via INTRAVENOUS

## 2018-07-27 MED ORDER — BACITRACIN ZINC 500 UNIT/GM EX OINT
TOPICAL_OINTMENT | CUTANEOUS | Status: DC | PRN
  Administered 2018-07-27: 1 via TOPICAL

## 2018-07-27 MED ORDER — TETANUS-DIPHTH-ACELL PERTUSSIS 5-2.5-18.5 LF-MCG/0.5 IM SUSP
0.5000 mL | Freq: Once | INTRAMUSCULAR | Status: AC
  Administered 2018-07-27: 0.5 mL via INTRAMUSCULAR
  Filled 2018-07-27: qty 0.5

## 2018-07-27 MED ORDER — FENTANYL CITRATE (PF) 250 MCG/5ML IJ SOLN
INTRAMUSCULAR | Status: AC
  Filled 2018-07-27: qty 5

## 2018-07-27 MED ORDER — PROPOFOL 10 MG/ML IV BOLUS
INTRAVENOUS | Status: AC
  Filled 2018-07-27: qty 20

## 2018-07-27 MED ORDER — CHLORHEXIDINE GLUCONATE 0.12 % MT SOLN
OROMUCOSAL | Status: AC
  Filled 2018-07-27: qty 15

## 2018-07-27 MED ORDER — AMOXICILLIN 400 MG/5ML PO SUSR: 800.0000 mg | Freq: Two times a day (BID) | ORAL | 0 refills | Status: AC

## 2018-07-27 MED ORDER — MIDAZOLAM HCL 2 MG/2ML IJ SOLN
INTRAMUSCULAR | Status: AC
  Filled 2018-07-27: qty 2

## 2018-07-27 MED ORDER — ACETAMINOPHEN 10 MG/ML IV SOLN: 1000.0000 mg | Freq: Once | INTRAVENOUS | Status: DC | PRN

## 2018-07-27 MED ORDER — ONDANSETRON HCL 4 MG/2ML IJ SOLN
INTRAMUSCULAR | Status: AC
  Filled 2018-07-27: qty 2

## 2018-07-27 MED ORDER — ROCURONIUM BROMIDE 50 MG/5ML IV SOSY
PREFILLED_SYRINGE | INTRAVENOUS | Status: DC | PRN
  Administered 2018-07-27: 40 mg via INTRAVENOUS

## 2018-07-27 MED ORDER — OXYMETAZOLINE HCL 0.05 % NA SOLN
NASAL | Status: DC | PRN
  Administered 2018-07-27: 1

## 2018-07-27 MED ORDER — PROPOFOL 10 MG/ML IV BOLUS
INTRAVENOUS | Status: DC | PRN
  Administered 2018-07-27: 200 mg via INTRAVENOUS

## 2018-07-27 MED ORDER — 0.9 % SODIUM CHLORIDE (POUR BTL) OPTIME
TOPICAL | Status: DC | PRN
  Administered 2018-07-27: 1000 mL

## 2018-07-27 MED ORDER — DEXAMETHASONE SODIUM PHOSPHATE 10 MG/ML IJ SOLN
INTRAMUSCULAR | Status: DC | PRN
  Administered 2018-07-27: 10 mg via INTRAVENOUS

## 2018-07-27 MED ORDER — FENTANYL CITRATE (PF) 100 MCG/2ML IJ SOLN
100.0000 ug | Freq: Once | INTRAMUSCULAR | Status: AC
  Administered 2018-07-27: 100 ug via INTRAVENOUS
  Filled 2018-07-27: qty 2

## 2018-07-27 MED ORDER — HYDROMORPHONE HCL 1 MG/ML IJ SOLN: 0.2500 mg | INTRAMUSCULAR | Status: DC | PRN

## 2018-07-27 MED ORDER — OXYMETAZOLINE HCL 0.05 % NA SOLN
NASAL | Status: AC
  Filled 2018-07-27: qty 30

## 2018-07-27 MED ORDER — SUCCINYLCHOLINE CHLORIDE 200 MG/10ML IV SOSY
PREFILLED_SYRINGE | INTRAVENOUS | Status: DC | PRN
  Administered 2018-07-27: 200 mg via INTRAVENOUS

## 2018-07-27 MED ORDER — SODIUM CHLORIDE 0.9 % IV BOLUS (SEPSIS)
2000.0000 mL | Freq: Once | INTRAVENOUS | Status: AC
  Administered 2018-07-27: 2000 mL via INTRAVENOUS

## 2018-07-27 MED ORDER — PROMETHAZINE HCL 25 MG/ML IJ SOLN: 6.2500 mg | INTRAMUSCULAR | Status: DC | PRN

## 2018-07-27 MED ORDER — LACTATED RINGERS IV SOLN
INTRAVENOUS | Status: DC
  Administered 2018-07-27 (×2): via INTRAVENOUS

## 2018-07-27 MED ORDER — HYDROCODONE-ACETAMINOPHEN 7.5-325 MG/15ML PO SOLN: 15.0000 mL | Freq: Four times a day (QID) | ORAL | 0 refills | Status: AC | PRN

## 2018-07-27 MED ORDER — EPHEDRINE 5 MG/ML INJ
INTRAVENOUS | Status: AC
  Filled 2018-07-27: qty 10

## 2018-07-27 MED ORDER — LIDOCAINE-EPINEPHRINE (PF) 2 %-1:200000 IJ SOLN
10.0000 mL | Freq: Once | INTRAMUSCULAR | Status: AC
  Administered 2018-07-27: 10 mL
  Filled 2018-07-27: qty 20

## 2018-07-27 MED ORDER — CLINDAMYCIN PHOSPHATE 900 MG/50ML IV SOLN
900.0000 mg | Freq: Once | INTRAVENOUS | Status: AC
  Administered 2018-07-27: 900 mg via INTRAVENOUS
  Filled 2018-07-27: qty 50

## 2018-07-27 MED ORDER — HYDROCODONE-ACETAMINOPHEN 7.5-325 MG PO TABS: 1.0000 | ORAL_TABLET | Freq: Once | ORAL | Status: DC | PRN

## 2018-07-27 MED ORDER — MIDAZOLAM HCL 5 MG/5ML IJ SOLN
INTRAMUSCULAR | Status: DC | PRN
  Administered 2018-07-27: 2 mg via INTRAVENOUS

## 2018-07-27 MED ORDER — DEXAMETHASONE SODIUM PHOSPHATE 10 MG/ML IJ SOLN
INTRAMUSCULAR | Status: AC
  Filled 2018-07-27: qty 2

## 2018-07-27 MED ORDER — FENTANYL CITRATE (PF) 100 MCG/2ML IJ SOLN
INTRAMUSCULAR | Status: DC | PRN
  Administered 2018-07-27: 50 ug via INTRAVENOUS
  Administered 2018-07-27: 100 ug via INTRAVENOUS
  Administered 2018-07-27 (×2): 50 ug via INTRAVENOUS

## 2018-07-27 MED ORDER — FENTANYL CITRATE (PF) 100 MCG/2ML IJ SOLN
50.0000 ug | Freq: Once | INTRAMUSCULAR | Status: AC
  Administered 2018-07-27: 50 ug via INTRAVENOUS
  Filled 2018-07-27: qty 2

## 2018-07-27 MED ORDER — SUGAMMADEX SODIUM 200 MG/2ML IV SOLN
INTRAVENOUS | Status: DC | PRN
  Administered 2018-07-27: 200 mg via INTRAVENOUS

## 2018-07-27 MED ORDER — PHENYLEPHRINE 40 MCG/ML (10ML) SYRINGE FOR IV PUSH (FOR BLOOD PRESSURE SUPPORT)
PREFILLED_SYRINGE | INTRAVENOUS | Status: AC
  Filled 2018-07-27: qty 10

## 2018-07-27 MED ORDER — SUCCINYLCHOLINE CHLORIDE 200 MG/10ML IV SOSY
PREFILLED_SYRINGE | INTRAVENOUS | Status: AC
  Filled 2018-07-27: qty 10

## 2018-07-27 MED ORDER — CHLORHEXIDINE GLUCONATE 0.12 % MT SOLN: OROMUCOSAL | 0 refills | Status: DC

## 2018-07-27 MED ORDER — CHLORHEXIDINE GLUCONATE 0.12 % MT SOLN
OROMUCOSAL | Status: DC | PRN
  Administered 2018-07-27: 15 mL via OROMUCOSAL

## 2018-07-27 MED ORDER — LIDOCAINE 2% (20 MG/ML) 5 ML SYRINGE
INTRAMUSCULAR | Status: AC
  Filled 2018-07-27: qty 5

## 2018-07-27 MED ORDER — LIDOCAINE 2% (20 MG/ML) 5 ML SYRINGE
INTRAMUSCULAR | Status: DC | PRN
  Administered 2018-07-27: 100 mg via INTRAVENOUS

## 2018-07-27 MED ORDER — ROCURONIUM BROMIDE 10 MG/ML (PF) SYRINGE
PREFILLED_SYRINGE | INTRAVENOUS | Status: AC
  Filled 2018-07-27: qty 10

## 2018-07-27 MED ORDER — MEPERIDINE HCL 25 MG/ML IJ SOLN: 6.2500 mg | INTRAMUSCULAR | Status: DC | PRN

## 2018-07-27 SURGICAL SUPPLY — 35 items
BAG DECANTER FOR FLEXI CONT (MISCELLANEOUS) IMPLANT
BAND DENTAL 1/4IN PULL MED (MISCELLANEOUS) ×2 IMPLANT
BLADE SURG 15 STRL LF DISP TIS (BLADE) IMPLANT
BLADE SURG 15 STRL SS (BLADE)
CANISTER SUCT 3000ML PPV (MISCELLANEOUS) ×3 IMPLANT
CLEANER TIP ELECTROSURG 2X2 (MISCELLANEOUS) ×3 IMPLANT
COVER WAND RF STERILE (DRAPES) ×3 IMPLANT
DRAPE HALF SHEET 40X57 (DRAPES) IMPLANT
ELECT COATED BLADE 2.86 ST (ELECTRODE) IMPLANT
ELECT NDL BLADE 2-5/6 (NEEDLE) IMPLANT
ELECT NEEDLE BLADE 2-5/6 (NEEDLE) IMPLANT
ELECT REM PT RETURN 9FT ADLT (ELECTROSURGICAL) ×3
ELECTRODE REM PT RTRN 9FT ADLT (ELECTROSURGICAL) ×1 IMPLANT
GLOVE BIOGEL PI IND STRL 7.5 (GLOVE) IMPLANT
GLOVE BIOGEL PI INDICATOR 7.5 (GLOVE) ×2
GLOVE ECLIPSE 7.5 STRL STRAW (GLOVE) ×3 IMPLANT
GLOVE SURG SS PI 7.5 STRL IVOR (GLOVE) ×6 IMPLANT
GOWN STRL REUS W/ TWL LRG LVL3 (GOWN DISPOSABLE) ×2 IMPLANT
GOWN STRL REUS W/TWL LRG LVL3 (GOWN DISPOSABLE) ×6
KIT BASIN OR (CUSTOM PROCEDURE TRAY) ×3 IMPLANT
KIT TURNOVER KIT B (KITS) ×3 IMPLANT
NDL HYPO 25GX1X1/2 BEV (NEEDLE) IMPLANT
NEEDLE HYPO 25GX1X1/2 BEV (NEEDLE) ×3 IMPLANT
NS IRRIG 1000ML POUR BTL (IV SOLUTION) ×3 IMPLANT
PAD ARMBOARD 7.5X6 YLW CONV (MISCELLANEOUS) ×8 IMPLANT
PENCIL BUTTON HOLSTER BLD 10FT (ELECTRODE) ×3 IMPLANT
PLATE SMARTLOCK MANDIBULAR FX (Plate) ×4 IMPLANT
SCISSORS WIRE ANG 4 3/4 DISP (INSTRUMENTS) ×2 IMPLANT
SCREW LOCKING SELF DRILL 2.0X6 (Screw) ×16 IMPLANT
SUT VIC AB 3-0 FS2 27 (SUTURE) IMPLANT
TOOTHBRUSH ADULT (PERSONAL CARE ITEMS) ×3 IMPLANT
TOWEL GREEN STERILE FF (TOWEL DISPOSABLE) ×3 IMPLANT
TRAY ENT MC OR (CUSTOM PROCEDURE TRAY) ×3 IMPLANT
TRAY MODULE STERIL MMF REUSE (Plate) IMPLANT
WATER STERILE IRR 1000ML POUR (IV SOLUTION) IMPLANT

## 2018-07-27 NOTE — Discharge Instructions (Addendum)
The patient should be on liquid diet during the duration of the mandibular fixation. The fixation should only be released in case of emergency.  ----------------------------------------------------   Jaw Fracture Eating Plan A break (fracture) of the jaw bone often needs surgery for treatment. After surgery, you will need to eat foods that can be blended so that they can be sipped from a straw or given through a syringe. Work with a diet and nutrition specialist (dietitian) to create an eating plan that helps you get the nutrients you need in order to heal and stay healthy. What are tips for following this plan? General guidelines  All foods in this plan must be blended. Avoid nuts, seeds, skins, peels, bones, or any foods that cannot be blended to the right consistency.  Ask your health care provider about taking a liquid multivitamin to make sure that you get all the vitamins and minerals you need. Cooking   Before blending, remove any skins, seeds, or peels from food.  Cook meats and vegetables until tender.  Cut foods into small pieces and mix with a small amount of liquid in a food processor or blender. Continue to add liquid until the food becomes thin enough to sip through a straw.  Add liquids such as juice, milk, cream, broth, gravy, or vegetable juice to help add flavor to foods.  Heat foods after they have been blended, not before. This reduces the amount of foam created from blending.  If you need to increase calories in food: ? Add protein powder or powdered milk to foods. ? Cook with fats, such as margarine (without trans fat), sour cream, cream cheese, cream, or nut butters. ? Prepare foods with sweeteners, such as honey, ice cream, blackstrap molasses, or sugar. Meal planning  Eat at least three meals and three snacks daily. It is important to make sure that you get enough calories and protein to prevent weight loss and help your body heal, especially after  surgery.  Eat a variety of foods from each food group every day, including fruits and vegetables, protein, whole grains, dairy, and healthy fats.  If your teeth and mouth are sensitive to extreme temperatures, heat or cool your foods to lukewarm temperatures. What foods are recommended? The items listed may not be a complete list. Talk with your dietitian about what dietary choices are best for you. Grains Hot cereals, such as oatmeal, grits, ground wheat cereals, and polenta. Rice and pasta. Couscous. Vegetables All cooked or canned vegetables, without seeds and skins. Vegetable juices. Cooked potatoes, without skins. Fruits Any cooked or canned fruits, without seeds and skins. Fresh, peeled soft fruits, such as bananas and peaches, that can be blended until smooth. All fruit juices, without seeds and skins. Meat and other protein foods Soft-boiled eggs, scrambled eggs, powdered eggs, pasteurized egg mixtures, and custard. Ground meats, such as hamburger, [place]urkey, sausage, and meatloaf. Tender, well-cooked meat, poultry, and fish, prepared without bones or skin. Soft soy foods, such as tofu. Smooth nut butters. Liquid egg substitutes. Dairy Milk. Cheese. Yogurt. Cottage cheese. Pudding. Beverages Coffee (regular or decaffeinated), tea, and mineral water. Liquid supplements that have protein and calories. Fats and oils Any oils. Melted margarine or butter. Ghee. Sour cream. Cream cheese. Avocado. Seasoning and other foods All seasonings and condiments that blend well. Ground spices. Finely ground seeds and nuts. Mustard or any smooth condiment. Summary  Foods in this plan need to be prepared so that they can be sipped from a straw or given through  a syringe. Try to have at least three meals and three snacks daily.  Avoid nuts, seeds, skins, peels, bones, or any foods that cannot be blended to the right consistency. Make sure you eat a variety of foods from each food group every  day.  Include a liquid multivitamin in your plan as told by your health care provider or dietitian. This information is not intended to replace advice given to you by your health care provider. Make sure you discuss any questions you have with your health care provider.   ----------------------------  Jaw exercise after release of fixation (After your Office visit)  Range of motion exercises Repeat each of these exercises 8 times, 1-2 times a day, or as told by your health care provider. Exercise A: Forward protrusion 1. Push your jaw forward. Hold this position for 1-2 seconds. 2. Allow your jaw to return to its normal position and rest it there for 1-2 seconds. Exercise B: Controlled opening 1. Stand or sit in front of a mirror. Place your tongue on the roof of your mouth, just behind your top teeth. 2. Keeping your tongue on the roof of your mouth, slowly open and close your mouth. 3. While you open and close your mouth, watch your jaw in the mirror. Try to keep your jaw from moving to one side or the other. Exercise C: Right and left motion 1. Move your jaw right. Hold this position for 1-2 seconds. Allow your jaw to return to its normal position, and rest it there for 1-2 seconds. 2. Move your jaw left. Hold this position for 1-2 seconds. Allow your jaw to return to its normal position, and rest it there for 1-2 seconds.
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
## 2018-07-27 NOTE — Anesthesia Procedure Notes (Addendum)
Procedure Name: Intubation Date/Time: 07/27/2018 9:20 AM Performed by: Lavell Luster, CRNA Pre-anesthesia Checklist: Patient identified, Emergency Drugs available, Suction available, Patient being monitored and Timeout performed Patient Re-evaluated:Patient Re-evaluated prior to induction Oxygen Delivery Method: Circle system utilized Preoxygenation: Pre-oxygenation with 100% oxygen Induction Type: IV induction and Rapid sequence Laryngoscope Size: Mac, 4 and Glidescope Grade View: Grade I Nasal Tubes: Left Tube size: 7.0 mm Number of attempts: 1 Airway Equipment and Method: Video-laryngoscopy Placement Confirmation: ETT inserted through vocal cords under direct vision,  positive ETCO2 and breath sounds checked- equal and bilateral Comments: Attempted nasal intubation on right side after Afrin and nasal trumpet dilation.  7.5 ETT resistance met.  Switched to left nare, 7.0 nasal tube passed easily.  Eyes padded, ETT secured.  Dr Lanetta Inch passed ETT through cords easily.  +ETCO2 noted.  Henderson Cloud, CRNA

## 2018-07-27 NOTE — ED Notes (Signed)
Pt's belongings packed and bagged; transported with pt to Short Stay, [place] [facility].
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
## 2018-07-27 NOTE — Anesthesia Preprocedure Evaluation (Addendum)
Anesthesia Evaluation  Patient identified by MRN, date of birth, ID band Patient awake    Reviewed: Allergy & Precautions, NPO status , Patient's Chart, lab work & pertinent test results  Airway Mallampati: II  TM Distance: >3 FB Neck ROM: Full   Comment: Mandibular fx  Dental no notable dental hx. (+) Missing, Dental Advisory Given,    Pulmonary neg pulmonary ROS,    Pulmonary exam normal breath sounds clear to auscultation       Cardiovascular Normal cardiovascular exam Rhythm:Regular Rate:Normal     Neuro/Psych negative neurological ROS     GI/Hepatic   Endo/Other    Renal/GU      Musculoskeletal   Abdominal   Peds  Hematology   Anesthesia Other Findings Pt reports loose and missing teeth prior to induction of anesthesia   Reproductive/Obstetrics                            Lab Results  Component Value Date   WBC 17.4 (H) 07/27/2018   HGB 14.6 07/27/2018   HCT 43.0 07/27/2018   MCV 83.1 07/27/2018   PLT 232 07/27/2018   Lab Results  Component Value Date   CREATININE 1.30 (H) 07/27/2018   BUN 13 07/27/2018   NA 140 07/27/2018   K 3.2 (L) 07/27/2018   CL 105 07/27/2018   CO2 22 07/27/2018      Anesthesia Physical Anesthesia Plan  ASA: II  Anesthesia Plan: General   Post-op Pain Management:    Induction: Intravenous  PONV Risk Score and Plan: 4 or greater and Treatment may vary due to age or medical condition, Dexamethasone and Ondansetron  Airway Management Planned: Nasal ETT  Additional Equipment:   Intra-op Plan:   Post-operative Plan: Extubation in OR  Informed Consent: I have reviewed the patients History and Physical, chart, labs and discussed the procedure including the risks, benefits and alternatives for the proposed anesthesia with the patient or authorized representative who has indicated his/her understanding and acceptance.     Dental advisory  given  Plan Discussed with:   Anesthesia Plan Comments:         Anesthesia Quick Evaluation
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
## 2018-07-27 NOTE — ED Notes (Signed)
Pt's contact (son's mother): Donnalee Curry [phone]
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
## 2018-07-27 NOTE — Transfer of Care (Signed)
Immediate Anesthesia Transfer of Care Note  Patient: Jesus Riley  Procedure(s) Performed: OPEN REDUCTION INTERNAL FIXATION (ORIF) MANDIBULAR FRACTURE (N/A Mouth)  Patient Location: PACU  Anesthesia Type:General  Level of Consciousness: awake  Airway & Oxygen Therapy: Patient connected to face mask oxygen  Post-op Assessment: Post -op Vital signs reviewed and stable  Post vital signs: stable  Last Vitals:  Vitals Value Taken Time  BP    Temp    Pulse 112 07/27/18 1047  Resp 28 07/27/18 1047  SpO2 86 % 07/27/18 1047  Vitals shown include unvalidated device data.  Last Pain:  Vitals:   07/27/18 0743  TempSrc:   PainSc: 10-Worst pain ever         Complications: No apparent anesthesia complications

## 2018-07-28 NOTE — Anesthesia Postprocedure Evaluation (Signed)
Anesthesia Post Note  Patient: Jesus Riley  Procedure(s) Performed: OPEN REDUCTION INTERNAL FIXATION (ORIF) MANDIBULAR FRACTURE (N/A Mouth)     Patient location during evaluation: PACU Anesthesia Type: General Level of consciousness: awake and alert Pain management: pain level controlled Vital Signs Assessment: post-procedure vital signs reviewed and stable Respiratory status: spontaneous breathing, nonlabored ventilation, respiratory function stable and patient connected to nasal cannula oxygen Cardiovascular status: blood pressure returned to baseline and stable Postop Assessment: no apparent nausea or vomiting Anesthetic complications: no    Last Vitals:  Vitals:   07/27/18 1224 07/27/18 1342  BP: 134/81 133/81  Pulse: 85 88  Resp: 16 14  Temp: (!) 36.3 C (!) 36.1 C  SpO2: 93% 97%    Last Pain:  Vitals:   07/27/18 1224  TempSrc:   PainSc: 0-No pain   Pain Goal:                   Micki Cassel L Advay Volante

## 2018-07-31 ENCOUNTER — Encounter (HOSPITAL_COMMUNITY): Payer: Self-pay | Admitting: Otolaryngology

## 2018-08-17 ENCOUNTER — Ambulatory Visit (INDEPENDENT_AMBULATORY_CARE_PROVIDER_SITE_OTHER): Payer: Self-pay | Admitting: Otolaryngology

## 2018-08-17 ENCOUNTER — Other Ambulatory Visit: Payer: Self-pay

## 2018-08-28 ENCOUNTER — Other Ambulatory Visit: Payer: Self-pay | Admitting: Otolaryngology

## 2018-09-01 ENCOUNTER — Other Ambulatory Visit: Payer: Self-pay

## 2018-09-01 ENCOUNTER — Encounter (HOSPITAL_BASED_OUTPATIENT_CLINIC_OR_DEPARTMENT_OTHER): Payer: Self-pay | Admitting: *Deleted

## 2018-09-05 ENCOUNTER — Other Ambulatory Visit (HOSPITAL_COMMUNITY)
Admission: RE | Admit: 2018-09-05 | Discharge: 2018-09-05 | Disposition: A | Discharge status: Home / Self Care | Payer: HRSA Program | Source: Ambulatory Visit | Attending: Otolaryngology | Admitting: Otolaryngology

## 2018-09-05 DIAGNOSIS — Z20828 Contact with and (suspected) exposure to other viral communicable diseases: Secondary | ICD-10-CM | POA: Insufficient documentation

## 2018-09-05 DIAGNOSIS — Z01812 Encounter for preprocedural laboratory examination: Secondary | ICD-10-CM | POA: Insufficient documentation

## 2018-09-05 LAB — SARS CORONAVIRUS 2 (TAT 6-24 HRS): SARS Coronavirus 2: NEGATIVE

## 2018-09-05 NOTE — Progress Notes (Signed)
Placed call to patient for missed COVID appointment this morning patient stated that he will be coming this afternoon

## 2018-09-08 ENCOUNTER — Encounter (HOSPITAL_BASED_OUTPATIENT_CLINIC_OR_DEPARTMENT_OTHER)
Admission: RE | Disposition: A | Discharge status: Home / Self Care | Payer: Self-pay | Source: Home / Self Care | Attending: Otolaryngology

## 2018-09-08 ENCOUNTER — Ambulatory Visit (HOSPITAL_BASED_OUTPATIENT_CLINIC_OR_DEPARTMENT_OTHER): Payer: Self-pay | Admitting: Certified Registered"

## 2018-09-08 ENCOUNTER — Ambulatory Visit (HOSPITAL_BASED_OUTPATIENT_CLINIC_OR_DEPARTMENT_OTHER)
Admission: RE | Admit: 2018-09-08 | Discharge: 2018-09-08 | Disposition: A | Discharge status: Home / Self Care | Payer: Self-pay | Attending: Otolaryngology | Admitting: Otolaryngology

## 2018-09-08 ENCOUNTER — Encounter (HOSPITAL_BASED_OUTPATIENT_CLINIC_OR_DEPARTMENT_OTHER): Payer: Self-pay | Admitting: Anesthesiology

## 2018-09-08 DIAGNOSIS — S02642D Fracture of ramus of left mandible, subsequent encounter for fracture with routine healing: Secondary | ICD-10-CM | POA: Insufficient documentation

## 2018-09-08 DIAGNOSIS — Z87891 Personal history of nicotine dependence: Secondary | ICD-10-CM | POA: Insufficient documentation

## 2018-09-08 HISTORY — PX: MANDIBULAR HARDWARE REMOVAL: SHX5205

## 2018-09-08 SURGERY — REMOVAL, HARDWARE, MANDIBLE
Anesthesia: General | Site: Mouth

## 2018-09-08 MED ORDER — PROPOFOL 500 MG/50ML IV EMUL
INTRAVENOUS | Status: DC | PRN
  Administered 2018-09-08: 25 ug/kg/min via INTRAVENOUS

## 2018-09-08 MED ORDER — HYDROMORPHONE HCL 1 MG/ML IJ SOLN
INTRAMUSCULAR | Status: AC
  Filled 2018-09-08: qty 0.5

## 2018-09-08 MED ORDER — FENTANYL CITRATE (PF) 100 MCG/2ML IJ SOLN
INTRAMUSCULAR | Status: AC
  Filled 2018-09-08: qty 2

## 2018-09-08 MED ORDER — MIDAZOLAM HCL 5 MG/5ML IJ SOLN
INTRAMUSCULAR | Status: DC | PRN
  Administered 2018-09-08: 2 mg via INTRAVENOUS

## 2018-09-08 MED ORDER — OXYCODONE HCL 5 MG/5ML PO SOLN: 5.0000 mg | Freq: Once | ORAL | Status: DC | PRN

## 2018-09-08 MED ORDER — HYDROMORPHONE HCL 1 MG/ML IJ SOLN
0.2500 mg | INTRAMUSCULAR | Status: DC | PRN
  Administered 2018-09-08 (×2): 0.5 mg via INTRAVENOUS

## 2018-09-08 MED ORDER — OXYCODONE HCL 5 MG PO TABS: 5.0000 mg | ORAL_TABLET | Freq: Once | ORAL | Status: DC | PRN

## 2018-09-08 MED ORDER — MIDAZOLAM HCL 2 MG/2ML IJ SOLN: 1.0000 mg | INTRAMUSCULAR | Status: DC | PRN

## 2018-09-08 MED ORDER — PROMETHAZINE HCL 25 MG/ML IJ SOLN: 6.2500 mg | INTRAMUSCULAR | Status: DC | PRN

## 2018-09-08 MED ORDER — FENTANYL CITRATE (PF) 100 MCG/2ML IJ SOLN
INTRAMUSCULAR | Status: DC | PRN
  Administered 2018-09-08: 100 ug via INTRAVENOUS

## 2018-09-08 MED ORDER — PROPOFOL 500 MG/50ML IV EMUL
INTRAVENOUS | Status: AC
  Filled 2018-09-08: qty 50

## 2018-09-08 MED ORDER — LIDOCAINE-EPINEPHRINE 1 %-1:100000 IJ SOLN
INTRAMUSCULAR | Status: DC | PRN
  Administered 2018-09-08: 1 mL

## 2018-09-08 MED ORDER — LIDOCAINE-EPINEPHRINE 1 %-1:100000 IJ SOLN
INTRAMUSCULAR | Status: AC
  Filled 2018-09-08: qty 1

## 2018-09-08 MED ORDER — FENTANYL CITRATE (PF) 100 MCG/2ML IJ SOLN: 50.0000 ug | INTRAMUSCULAR | Status: DC | PRN

## 2018-09-08 MED ORDER — LACTATED RINGERS IV SOLN
INTRAVENOUS | Status: DC
  Administered 2018-09-08 (×2): via INTRAVENOUS

## 2018-09-08 MED ORDER — MIDAZOLAM HCL 2 MG/2ML IJ SOLN
INTRAMUSCULAR | Status: AC
  Filled 2018-09-08: qty 2

## 2018-09-08 MED ORDER — ONDANSETRON HCL 4 MG/2ML IJ SOLN
INTRAMUSCULAR | Status: DC | PRN
  Administered 2018-09-08: 4 mg via INTRAVENOUS

## 2018-09-08 SURGICAL SUPPLY — 27 items
BLADE SURG 15 STRL LF DISP TIS (BLADE) ×1 IMPLANT
BLADE SURG 15 STRL SS (BLADE) ×6
CANISTER SUCT 1200ML W/VALVE (MISCELLANEOUS) ×3 IMPLANT
COVER MAYO STAND REUSABLE (DRAPES) ×3 IMPLANT
COVER WAND RF STERILE (DRAPES) IMPLANT
DECANTER SPIKE VIAL GLASS SM (MISCELLANEOUS) ×1 IMPLANT
DRAPE HALF SHEET 70X43 (DRAPES) ×3 IMPLANT
GAUZE 4X4 16PLY RFD (DISPOSABLE) IMPLANT
GAUZE SPONGE 4X4 12PLY STRL LF (GAUZE/BANDAGES/DRESSINGS) IMPLANT
GLOVE BIO SURGEON STRL SZ7.5 (GLOVE) ×3 IMPLANT
GOWN STRL REUS W/ TWL LRG LVL3 (GOWN DISPOSABLE) ×2 IMPLANT
GOWN STRL REUS W/TWL LRG LVL3 (GOWN DISPOSABLE) ×9
MARKER SKIN DUAL TIP RULER LAB (MISCELLANEOUS) IMPLANT
NDL PRECISIONGLIDE 27X1.5 (NEEDLE) ×1 IMPLANT
NEEDLE PRECISIONGLIDE 27X1.5 (NEEDLE) ×3 IMPLANT
NS IRRIG 1000ML POUR BTL (IV SOLUTION) ×1 IMPLANT
PACK BASIN DAY SURGERY FS (CUSTOM PROCEDURE TRAY) ×3 IMPLANT
SCISSORS WIRE ANG 4 3/4 DISP (INSTRUMENTS) IMPLANT
SUT CHROMIC 3 0 PS 2 (SUTURE) ×2 IMPLANT
SUT CHROMIC 4 0 PS 2 18 (SUTURE) IMPLANT
SUT CHROMIC 4 0 RB 1X27 (SUTURE) IMPLANT
SYR CONTROL 10ML LL (SYRINGE) ×3 IMPLANT
TOWEL GREEN STERILE FF (TOWEL DISPOSABLE) ×3 IMPLANT
TRAY DSU PREP LF (CUSTOM PROCEDURE TRAY) IMPLANT
TUBE CONNECTING 20'X1/4 (TUBING) ×1
TUBE CONNECTING 20X1/4 (TUBING) ×2 IMPLANT
YANKAUER SUCT BULB TIP NO VENT (SUCTIONS) IMPLANT
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
## 2018-09-08 NOTE — Anesthesia Preprocedure Evaluation (Signed)
Anesthesia Evaluation  Patient identified by MRN, date of birth, ID band Patient awake    Reviewed: Allergy & Precautions, NPO status , Patient's Chart, lab work & pertinent test results  Airway Mallampati: II  TM Distance: >3 FB Neck ROM: Full   Comment: Mandibular fx  Dental no notable dental hx. (+) Missing, Dental Advisory Given,    Pulmonary neg pulmonary ROS, Patient abstained from smoking., former smoker,    Pulmonary exam normal breath sounds clear to auscultation       Cardiovascular Normal cardiovascular exam Rhythm:Regular Rate:Normal     Neuro/Psych negative neurological ROS     GI/Hepatic   Endo/Other    Renal/GU      Musculoskeletal   Abdominal   Peds  Hematology   Anesthesia Other Findings Pt reports loose and missing teeth prior to induction of anesthesia   Reproductive/Obstetrics                             Lab Results  Component Value Date   WBC 17.4 (H) 07/27/2018   HGB 14.6 07/27/2018   HCT 43.0 07/27/2018   MCV 83.1 07/27/2018   PLT 232 07/27/2018   Lab Results  Component Value Date   CREATININE 1.30 (H) 07/27/2018   BUN 13 07/27/2018   NA 140 07/27/2018   K 3.2 (L) 07/27/2018   CL 105 07/27/2018   CO2 22 07/27/2018      Anesthesia Physical  Anesthesia Plan  ASA: II  Anesthesia Plan: General   Post-op Pain Management:    Induction: Intravenous  PONV Risk Score and Plan: 2 and Treatment may vary due to age or medical condition, Ondansetron and Midazolam  Airway Management Planned: Mask  Additional Equipment:   Intra-op Plan:   Post-operative Plan:   Informed Consent: I have reviewed the patients History and Physical, chart, labs and discussed the procedure including the risks, benefits and alternatives for the proposed anesthesia with the patient or authorized representative who has indicated his/her understanding and acceptance.      Dental advisory given  Plan Discussed with:   Anesthesia Plan Comments:         Anesthesia Quick Evaluation
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
## 2018-09-08 NOTE — Anesthesia Postprocedure Evaluation (Signed)
Anesthesia Post Note  Patient: Jesus Riley  Procedure(s) Performed: MANDIBULAR  FRACTURE HARDWARE REMOVAL (N/A Mouth)     Patient location during evaluation: PACU Anesthesia Type: General Level of consciousness: awake and alert Pain management: pain level controlled Vital Signs Assessment: post-procedure vital signs reviewed and stable Respiratory status: spontaneous breathing, nonlabored ventilation and respiratory function stable Cardiovascular status: blood pressure returned to baseline and stable Postop Assessment: no apparent nausea or vomiting Anesthetic complications: no    Last Vitals:  Vitals:   09/08/18 1019 09/08/18 1038  BP:  139/87  Pulse: 65 65  Resp: 14 18  Temp:  37.2 C  SpO2: 98% 100%    Last Pain:  Vitals:   09/08/18 1038  TempSrc: Oral  PainSc: 1                  Lynda Rainwater

## 2018-09-08 NOTE — Transfer of Care (Signed)
Immediate Anesthesia Transfer of Care Note  Patient: Jesus Riley  Procedure(s) Performed: MANDIBULAR  FRACTURE HARDWARE REMOVAL (N/A Mouth)  Patient Location: PACU  Anesthesia Type:MAC  Level of Consciousness: awake and patient cooperative  Airway & Oxygen Therapy: Patient Spontanous Breathing and Patient connected to face mask oxygen  Post-op Assessment: Report given to RN and Post -op Vital signs reviewed and stable  Post vital signs: Reviewed and stable  Last Vitals:  Vitals Value Taken Time  BP    Temp    Pulse 71 09/08/18 0925  Resp    SpO2 100 % 09/08/18 0925  Vitals shown include unvalidated device data.  Last Pain:  Vitals:   09/08/18 0825  TempSrc: Oral  PainSc: 0-No pain         Complications: No apparent anesthesia complications

## 2018-09-12 ENCOUNTER — Encounter (HOSPITAL_BASED_OUTPATIENT_CLINIC_OR_DEPARTMENT_OTHER): Payer: Self-pay | Admitting: Otolaryngology

## 2019-09-18 ENCOUNTER — Other Ambulatory Visit: Payer: Self-pay

## 2019-09-18 ENCOUNTER — Ambulatory Visit (HOSPITAL_COMMUNITY)
Admission: EM | Admit: 2019-09-18 | Discharge: 2019-09-18 | Disposition: A | Discharge status: Home / Self Care | Payer: Self-pay | Attending: Family Medicine | Admitting: Family Medicine

## 2019-09-18 DIAGNOSIS — Z113 Encounter for screening for infections with a predominantly sexual mode of transmission: Secondary | ICD-10-CM | POA: Insufficient documentation

## 2019-09-18 DIAGNOSIS — R369 Urethral discharge, unspecified: Secondary | ICD-10-CM | POA: Insufficient documentation

## 2019-09-18 LAB — POCT URINALYSIS DIPSTICK, ED / UC
Bilirubin Urine: NEGATIVE
Glucose, UA: NEGATIVE mg/dL
Ketones, ur: NEGATIVE mg/dL
Nitrite: NEGATIVE
Protein, ur: NEGATIVE mg/dL
Specific Gravity, Urine: >=1.03 (ref 1.005–1.030)
Urobilinogen, UA: 1 mg/dL (ref 0.0–1.0)
pH: 6 (ref 5.0–8.0)

## 2019-09-18 LAB — HIV ANTIBODY (ROUTINE TESTING W REFLEX): HIV Screen 4th Generation wRfx: NONREACTIVE

## 2019-09-18 MED ORDER — CEFTRIAXONE SODIUM 500 MG IJ SOLR
INTRAMUSCULAR | Status: AC
  Filled 2019-09-18: qty 500

## 2019-09-18 MED ORDER — CEFTRIAXONE SODIUM 500 MG IJ SOLR
500.0000 mg | Freq: Once | INTRAMUSCULAR | Status: AC
  Administered 2019-09-18: 500 mg via INTRAMUSCULAR

## 2019-09-18 MED ORDER — DOXYCYCLINE HYCLATE 100 MG PO CAPS: 100.0000 mg | ORAL_CAPSULE | Freq: Two times a day (BID) | ORAL | 0 refills | Status: AC

## 2019-09-18 MED ORDER — LIDOCAINE HCL (PF) 1 % IJ SOLN
INTRAMUSCULAR | Status: AC
  Filled 2019-09-18: qty 2
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
## 2019-09-18 NOTE — ED Triage Notes (Signed)
Pt presents with yellowish-green penile discharge and burning when urinating x 2 days. Denies nausea, back pain, fever.

## 2019-09-18 NOTE — Discharge Instructions (Signed)
We have treated you today for gonorrhea, with rocephin. Begin doxycycline twice daily for 1 week for chlamydia. Please refrain from sexual activity for 7 days while medicine is clearing infection.  We are testing you for HIV, Syphillis, Gonorrhea, Chlamydia and Trichomonas. We will call you if anything is positive and let you know if you require any further treatment. Please inform partner of any positive results.  Please return if symptoms not improving with treatment, development of fever, nausea, vomiting, abdominal pain, scrotal pain.

## 2019-09-19 LAB — RPR: RPR Ser Ql: NONREACTIVE

## 2019-09-19 LAB — CYTOLOGY, (ORAL, ANAL, URETHRAL) ANCILLARY ONLY
Chlamydia: POSITIVE — AB
Comment: NEGATIVE
Comment: NEGATIVE
Comment: NORMAL
Neisseria Gonorrhea: POSITIVE — AB
Trichomonas: NEGATIVE

## 2021-02-28 ENCOUNTER — Encounter (HOSPITAL_COMMUNITY): Payer: Self-pay | Admitting: Emergency Medicine

## 2021-02-28 ENCOUNTER — Other Ambulatory Visit: Payer: Self-pay

## 2021-02-28 ENCOUNTER — Emergency Department (HOSPITAL_COMMUNITY)
Admission: EM | Admit: 2021-02-28 | Discharge: 2021-02-28 | Disposition: A | Discharge status: Home / Self Care | Payer: No Typology Code available for payment source | Attending: Emergency Medicine | Admitting: Emergency Medicine

## 2021-02-28 DIAGNOSIS — M791 Myalgia, unspecified site: Secondary | ICD-10-CM | POA: Diagnosis present

## 2021-02-28 DIAGNOSIS — Y92410 Unspecified street and highway as the place of occurrence of the external cause: Secondary | ICD-10-CM | POA: Diagnosis not present

## 2021-02-28 MED ORDER — IBUPROFEN 400 MG PO TABS
600.0000 mg | ORAL_TABLET | Freq: Once | ORAL | Status: AC
  Administered 2021-02-28: 600 mg via ORAL
  Filled 2021-02-28: qty 1

## 2021-02-28 MED ORDER — ACETAMINOPHEN 325 MG PO TABS
650.0000 mg | ORAL_TABLET | Freq: Once | ORAL | Status: AC
  Administered 2021-02-28: 650 mg via ORAL
  Filled 2021-02-28: qty 2

## 2021-02-28 NOTE — Discharge Instructions (Signed)
Take motrin 600 mg every 8 hours as needed for pain.  Call your primary care doctor for follow up in 4-5 days as needed.  Return immediately back to the ER if:  Your symptoms worsen within the next 12-24 hours. You develop new symptoms such as new fevers, persistent vomiting, new pain, shortness of breath, or new weakness or numbness, or if you have any other concerns.
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
## 2022-11-03 ENCOUNTER — Encounter (HOSPITAL_BASED_OUTPATIENT_CLINIC_OR_DEPARTMENT_OTHER): Payer: Self-pay

## 2022-11-03 ENCOUNTER — Emergency Department (HOSPITAL_BASED_OUTPATIENT_CLINIC_OR_DEPARTMENT_OTHER)
Admission: EM | Admit: 2022-11-03 | Discharge: 2022-11-03 | Disposition: A | Discharge status: Home / Self Care | Payer: Self-pay | Attending: Emergency Medicine | Admitting: Emergency Medicine

## 2022-11-03 ENCOUNTER — Other Ambulatory Visit: Payer: Self-pay

## 2022-11-03 DIAGNOSIS — S61451A Open bite of right hand, initial encounter: Secondary | ICD-10-CM

## 2022-11-03 DIAGNOSIS — Z23 Encounter for immunization: Secondary | ICD-10-CM | POA: Insufficient documentation

## 2022-11-03 DIAGNOSIS — Z203 Contact with and (suspected) exposure to rabies: Secondary | ICD-10-CM | POA: Insufficient documentation

## 2022-11-03 DIAGNOSIS — S61200A Unspecified open wound of right index finger without damage to nail, initial encounter: Secondary | ICD-10-CM | POA: Insufficient documentation

## 2022-11-03 DIAGNOSIS — S50811A Abrasion of right forearm, initial encounter: Secondary | ICD-10-CM | POA: Insufficient documentation

## 2022-11-03 DIAGNOSIS — Z2914 Encounter for prophylactic rabies immune globin: Secondary | ICD-10-CM | POA: Insufficient documentation

## 2022-11-03 DIAGNOSIS — L0390 Cellulitis, unspecified: Secondary | ICD-10-CM | POA: Insufficient documentation

## 2022-11-03 DIAGNOSIS — S50812A Abrasion of left forearm, initial encounter: Secondary | ICD-10-CM | POA: Insufficient documentation

## 2022-11-03 DIAGNOSIS — W5501XA Bitten by cat, initial encounter: Secondary | ICD-10-CM | POA: Insufficient documentation

## 2022-11-03 MED ORDER — AMOXICILLIN-POT CLAVULANATE 875-125 MG PO TABS
1.0000 | ORAL_TABLET | Freq: Once | ORAL | Status: AC
  Administered 2022-11-03: 1 via ORAL
  Filled 2022-11-03: qty 1

## 2022-11-03 MED ORDER — RABIES IMMUNE GLOBULIN 150 UNIT/ML IM INJ
20.0000 [IU]/kg | INJECTION | Freq: Once | INTRAMUSCULAR | Status: AC
  Administered 2022-11-03: 1725 [IU] via INTRAMUSCULAR
  Filled 2022-11-03: qty 12

## 2022-11-03 MED ORDER — AMOXICILLIN-POT CLAVULANATE 875-125 MG PO TABS
1.0000 | ORAL_TABLET | Freq: Two times a day (BID) | ORAL | 0 refills | Status: AC
  Filled 2022-11-03: qty 14, 7d supply, fill #0

## 2022-11-03 MED ORDER — TETANUS-DIPHTH-ACELL PERTUSSIS 5-2.5-18.5 LF-MCG/0.5 IM SUSY
0.5000 mL | PREFILLED_SYRINGE | Freq: Once | INTRAMUSCULAR | Status: AC
  Administered 2022-11-03: 0.5 mL via INTRAMUSCULAR
  Filled 2022-11-03: qty 0.5

## 2022-11-03 MED ORDER — RABIES VACCINE, PCEC IM SUSR
1.0000 mL | Freq: Once | INTRAMUSCULAR | Status: AC
  Administered 2022-11-03: 1 mL via INTRAMUSCULAR
  Filled 2022-11-03: qty 1

## 2022-11-03 NOTE — Discharge Instructions (Addendum)
If you have not been vaccinated against rabies in the past, you need 4 doses of rabies vaccine over 2 weeks (given on days 0, 3, 7, and 14).

## 2022-11-03 NOTE — ED Provider Notes (Signed)
[facility] Provider Note   CSN: [id number] Arrival date & time: 11/03/22  1712     History  Chief Complaint  Patient presents with   Animal Bite    cat    Jesus Riley is a 42 y.o. male.  HPI     42 year old male with no significant medical history presents with concern for cat bite.  Reports that a stray cat was in his mother's basement and he was trying to help her get the cat out of the house and the cat ended up biting and scratching him.  He sustained a bite to his right hand and scratches to his bilateral forearms.  At first he did not think much of it, however today has had increasing pain and swelling particularly over his right MCP.  He reports some difficulty flexing and extending his finger.  He has not had any fever, nausea, vomiting or other systemic symptoms.  He is not sure when his last tetanus vaccination was.  He does not know who his this is, and its rabies vaccination status is unknown.  Home Medications Prior to Admission medications   Medication Sig Start Date End Date Taking? Authorizing Provider  amoxicillin-clavulanate (AUGMENTIN) 875-125 MG tablet Take 1 tablet by mouth every 12 (twelve) hours for 7 days. 11/03/22 11/10/22 Yes Alvira Monday, MD      Allergies    Morphine and codeine and Morphine and codeine    Review of Systems   Review of Systems  Physical Exam Updated Vital Signs BP 136/74 (BP Location: Left Arm)   Pulse 68   Temp 97.8 F (36.6 C)   Resp 18   Ht 5\' 11"  (1.803 m)   Wt 86.2 kg   SpO2 98%   BMI 26.50 kg/m  Physical Exam Vitals and nursing note reviewed.  Constitutional:      General: He is not in acute distress.    Appearance: Normal appearance. He is not ill-appearing, toxic-appearing or diaphoretic.  HENT:     Head: Normocephalic.  Eyes:     Conjunctiva/sclera: Conjunctivae normal.  Cardiovascular:     Rate and Rhythm: Normal rate and regular rhythm.      Pulses: Normal pulses.  Pulmonary:     Effort: Pulmonary effort is normal. No respiratory distress.  Musculoskeletal:        General: Tenderness (first MCP right hand) present. No deformity or signs of injury.     Cervical back: No rigidity.  Skin:    General: Skin is warm and dry.     Coloration: Skin is not jaundiced or pale.     Comments: See photo. Wounds to dorsum of index finger on right, single on palmar side, tender over MCP, no fluctuance, mild erythema. Able to flex and extend and joint. No digit swelling, no tenderness along flexor tendon sheath Superficial scratches bilateral volar foremars  Neurological:     General: No focal deficit present.     Mental Status: He is alert and oriented to person, place, and time.           ED Results / Procedures / Treatments   Labs (all labs ordered are listed, but only abnormal results are displayed) Labs Reviewed - No data to display  EKG None  Radiology No results found.  Procedures Procedures    Medications Ordered in ED Medications  rabies immune globulin (HYPERRAB/KEDRAB) injection 1,725 Units (has no administration in time range)  rabies vaccine (RABAVERT) injection 1  mL (has no administration in time range)  amoxicillin-clavulanate (AUGMENTIN) 875-125 MG per tablet 1 tablet (has no administration in time range)  Tdap (BOOSTRIX) injection 0.5 mL (has no administration in time range)    ED Course/ Medical Decision Making/ A&P                                  42 year old male with no significant medical history presents with concern for cat bite.  He is neurovascularly intact, do not suspect fracture.  While he has some pain and swelling, he does have the ability to flex and extend his finger and have low suspicion for tendon injury.  Do not see signs of fluctuance or purulence at this time, exam at this time is not consistent with flexor tenosynovitis although acknowledge this cat bite is high risk. No signs  of necrotizing infection, do not suspect retained tooth/FB. Do not suspect septic arthritis at this time. Do not see indication for emergent surgery or hospitalization.    Will provide number for hand surgery follow-up given high risk bite and concern for early changes of infection.  Given prescription for Augmentin.  Recommend Tylenol, ibuprofen for pain.  Updated his Tdap given unknown last booster, and gave rabies immunoglobulin and vaccine given stray cat with unknown status.  Discussed reasons to return. Patient discharged in stable condition with understanding of reasons to return.           Final Clinical Impression(s) / ED Diagnoses Final diagnoses:  Cat bite of right hand, initial encounter  Cellulitis, unspecified cellulitis site    Rx / DC Orders ED Discharge Orders          Ordered    amoxicillin-clavulanate (AUGMENTIN) 875-125 MG tablet  Every 12 hours        11/03/22 1747              Alvira Monday, MD 11/03/22 1751

## 2022-11-03 NOTE — ED Triage Notes (Signed)
Pt reports he was bitten by a stray cat last night around 2230. He presents with redness and swelling to right index finger and knuckle along with scattered scratch marks to his right arm and left wrist. He states he is unable to make a fist.

## 2022-11-03 NOTE — ED Notes (Signed)
Discharge paperwork reviewed entirely with patient, including follow up care. Pain was under control. The patient received instruction and coaching on their prescriptions, and all follow-up questions were answered.  Pt verbalized understanding as well as all parties involved. No questions or concerns voiced at the time of discharge. No acute distress noted.   Pt ambulated out to PVA without incident or assistance.  Pt advised they will notify their PCP immediately., Pt advised they will seek followup care with a specialist and followup with their PCP. , and Pt was given information to obtain and notify a PCP.   Patient given all information related to rabies vaccine. Follow up location selected by patient. [facility] on [facility].

## 2022-11-04 ENCOUNTER — Other Ambulatory Visit (HOSPITAL_BASED_OUTPATIENT_CLINIC_OR_DEPARTMENT_OTHER): Payer: Self-pay
